# Patient Record
Sex: Female | Born: 1955 | Race: Black or African American | Hispanic: No | Marital: Single | Smoking: Never smoker
Health system: Southern US, Community
[De-identification: ages and names within clinical notes are randomized; demographics above are authoritative.]

## PROBLEM LIST (undated history)

## (undated) ENCOUNTER — Emergency Department (HOSPITAL_COMMUNITY): Discharge status: Absent without leave | Payer: Self-pay

## (undated) DIAGNOSIS — H3320 Serous retinal detachment, unspecified eye: Secondary | ICD-10-CM

## (undated) DIAGNOSIS — I2510 Atherosclerotic heart disease of native coronary artery without angina pectoris: Secondary | ICD-10-CM

## (undated) DIAGNOSIS — R42 Dizziness and giddiness: Secondary | ICD-10-CM

## (undated) DIAGNOSIS — H409 Unspecified glaucoma: Secondary | ICD-10-CM

## (undated) DIAGNOSIS — M1990 Unspecified osteoarthritis, unspecified site: Secondary | ICD-10-CM

## (undated) DIAGNOSIS — I10 Essential (primary) hypertension: Secondary | ICD-10-CM

## (undated) DIAGNOSIS — H269 Unspecified cataract: Secondary | ICD-10-CM

## (undated) HISTORY — PX: EYE SURGERY: SHX253

## (undated) HISTORY — DX: Unspecified osteoarthritis, unspecified site: M19.90

## (undated) HISTORY — DX: Atherosclerotic heart disease of native coronary artery without angina pectoris: I25.10

---

## 1988-09-07 HISTORY — PX: TOTAL ABDOMINAL HYSTERECTOMY: SHX209

## 1997-12-15 ENCOUNTER — Emergency Department (HOSPITAL_COMMUNITY): Admission: EM | Admit: 1997-12-15 | Discharge: 1997-12-15 | Payer: Self-pay | Admitting: *Deleted

## 1998-01-15 ENCOUNTER — Emergency Department (HOSPITAL_COMMUNITY): Admission: EM | Admit: 1998-01-15 | Discharge: 1998-01-15 | Payer: Self-pay | Admitting: Emergency Medicine

## 1998-01-23 ENCOUNTER — Emergency Department (HOSPITAL_COMMUNITY): Admission: EM | Admit: 1998-01-23 | Discharge: 1998-01-23 | Payer: Self-pay | Admitting: Emergency Medicine

## 1998-01-24 ENCOUNTER — Encounter: Admission: RE | Admit: 1998-01-24 | Discharge: 1998-01-24 | Payer: Self-pay | Admitting: Obstetrics

## 1998-01-24 ENCOUNTER — Other Ambulatory Visit: Admission: RE | Admit: 1998-01-24 | Discharge: 1998-01-24 | Payer: Self-pay | Admitting: Obstetrics

## 1998-01-31 ENCOUNTER — Inpatient Hospital Stay (HOSPITAL_COMMUNITY): Admission: AD | Admit: 1998-01-31 | Discharge: 1998-02-04 | Payer: Self-pay | Admitting: Obstetrics & Gynecology

## 1998-02-19 ENCOUNTER — Encounter: Admission: RE | Admit: 1998-02-19 | Discharge: 1998-02-19 | Payer: Self-pay | Admitting: Obstetrics & Gynecology

## 1998-03-19 ENCOUNTER — Encounter: Admission: RE | Admit: 1998-03-19 | Discharge: 1998-03-19 | Payer: Self-pay | Admitting: Obstetrics & Gynecology

## 1998-03-22 ENCOUNTER — Encounter: Admission: RE | Admit: 1998-03-22 | Discharge: 1998-03-22 | Payer: Self-pay | Admitting: Obstetrics & Gynecology

## 1998-05-03 ENCOUNTER — Encounter: Admission: RE | Admit: 1998-05-03 | Discharge: 1998-05-03 | Payer: Self-pay | Admitting: Obstetrics & Gynecology

## 1998-06-25 ENCOUNTER — Inpatient Hospital Stay (HOSPITAL_COMMUNITY): Admission: RE | Admit: 1998-06-25 | Discharge: 1998-06-30 | Payer: Self-pay | Admitting: Obstetrics & Gynecology

## 1998-07-02 ENCOUNTER — Encounter: Admission: RE | Admit: 1998-07-02 | Discharge: 1998-07-02 | Payer: Self-pay | Admitting: Obstetrics & Gynecology

## 1998-07-11 ENCOUNTER — Encounter: Admission: RE | Admit: 1998-07-11 | Discharge: 1998-07-11 | Payer: Self-pay | Admitting: Obstetrics & Gynecology

## 2006-10-01 ENCOUNTER — Ambulatory Visit: Payer: Self-pay | Admitting: Internal Medicine

## 2006-10-11 ENCOUNTER — Ambulatory Visit: Payer: Self-pay | Admitting: *Deleted

## 2006-10-20 ENCOUNTER — Ambulatory Visit: Payer: Self-pay | Admitting: Family Medicine

## 2006-11-04 ENCOUNTER — Ambulatory Visit: Payer: Self-pay | Admitting: Internal Medicine

## 2006-11-10 ENCOUNTER — Ambulatory Visit (HOSPITAL_COMMUNITY): Admission: RE | Admit: 2006-11-10 | Discharge: 2006-11-10 | Payer: Self-pay | Admitting: Internal Medicine

## 2006-11-19 ENCOUNTER — Ambulatory Visit: Payer: Self-pay | Admitting: Family Medicine

## 2006-11-19 ENCOUNTER — Ambulatory Visit (HOSPITAL_COMMUNITY): Admission: RE | Admit: 2006-11-19 | Discharge: 2006-11-19 | Payer: Self-pay | Admitting: Internal Medicine

## 2007-05-19 DIAGNOSIS — I10 Essential (primary) hypertension: Secondary | ICD-10-CM | POA: Insufficient documentation

## 2007-05-25 ENCOUNTER — Encounter (INDEPENDENT_AMBULATORY_CARE_PROVIDER_SITE_OTHER): Payer: Self-pay | Admitting: *Deleted

## 2007-11-29 ENCOUNTER — Ambulatory Visit: Payer: Self-pay | Admitting: Internal Medicine

## 2007-11-29 LAB — CONVERTED CEMR LAB
Bilirubin Urine: NEGATIVE
KOH Prep: NEGATIVE
Ketones, urine, test strip: NEGATIVE
Protein, U semiquant: 30
Urobilinogen, UA: 0.2
Whiff Test: POSITIVE
pH: 5.5

## 2007-12-01 ENCOUNTER — Encounter (INDEPENDENT_AMBULATORY_CARE_PROVIDER_SITE_OTHER): Payer: Self-pay | Admitting: Internal Medicine

## 2007-12-01 LAB — CONVERTED CEMR LAB
BUN: 11 mg/dL (ref 6–23)
CO2: 29 meq/L (ref 19–32)
Calcium: 9.4 mg/dL (ref 8.4–10.5)
Chloride: 103 meq/L (ref 96–112)
Cholesterol: 178 mg/dL (ref 0–200)
Creatinine, Ser: 0.61 mg/dL (ref 0.40–1.20)
HDL: 50 mg/dL (ref >39)
Hemoglobin: 12.8 g/dL (ref 12.0–15.0)
LDL Cholesterol: 111 mg/dL — ABNORMAL HIGH (ref 0–99)
Lymphocytes Relative: 36 % (ref 12–46)
Lymphs Abs: 2.5 10*3/uL (ref 0.7–4.0)
MCHC: 32.6 g/dL (ref 30.0–36.0)
Platelets: 414 10*3/uL — ABNORMAL HIGH (ref 150–400)
RBC: 4.66 M/uL (ref 3.87–5.11)
Sodium: 141 meq/L (ref 135–145)
Total Bilirubin: 0.4 mg/dL (ref 0.3–1.2)
Total CHOL/HDL Ratio: 3.6
Total Protein: 7.6 g/dL (ref 6.0–8.3)
VLDL: 17 mg/dL (ref 0–40)
WBC: 6.8 10*3/uL (ref 4.0–10.5)

## 2007-12-09 ENCOUNTER — Ambulatory Visit (HOSPITAL_COMMUNITY): Admission: RE | Admit: 2007-12-09 | Discharge: 2007-12-09 | Payer: Self-pay | Admitting: Family Medicine

## 2007-12-16 ENCOUNTER — Ambulatory Visit (HOSPITAL_COMMUNITY): Admission: RE | Admit: 2007-12-16 | Discharge: 2007-12-16 | Payer: Self-pay | Admitting: Internal Medicine

## 2007-12-30 ENCOUNTER — Ambulatory Visit: Payer: Self-pay | Admitting: Internal Medicine

## 2008-01-27 ENCOUNTER — Ambulatory Visit: Payer: Self-pay | Admitting: Internal Medicine

## 2008-01-27 DIAGNOSIS — R143 Flatulence: Secondary | ICD-10-CM

## 2008-01-27 DIAGNOSIS — R141 Gas pain: Secondary | ICD-10-CM | POA: Insufficient documentation

## 2008-01-27 DIAGNOSIS — R142 Eructation: Secondary | ICD-10-CM

## 2008-02-17 ENCOUNTER — Ambulatory Visit: Payer: Self-pay | Admitting: Internal Medicine

## 2008-03-29 ENCOUNTER — Ambulatory Visit: Payer: Self-pay | Admitting: Internal Medicine

## 2008-04-27 ENCOUNTER — Ambulatory Visit: Payer: Self-pay | Admitting: Internal Medicine

## 2009-01-25 ENCOUNTER — Ambulatory Visit: Payer: Self-pay | Admitting: Internal Medicine

## 2009-01-25 LAB — CONVERTED CEMR LAB
ALT: 16 units/L (ref 0–35)
Albumin: 4.3 g/dL (ref 3.5–5.2)
Alkaline Phosphatase: 89 units/L (ref 39–117)
Calcium: 10.6 mg/dL — ABNORMAL HIGH (ref 8.4–10.5)
Chloride: 98 meq/L (ref 96–112)
Creatinine, Ser: 0.7 mg/dL (ref 0.40–1.20)
Glucose, Bld: 83 mg/dL (ref 70–99)

## 2009-01-28 ENCOUNTER — Ambulatory Visit (HOSPITAL_COMMUNITY): Admission: RE | Admit: 2009-01-28 | Discharge: 2009-01-28 | Payer: Self-pay | Admitting: Internal Medicine

## 2009-04-03 ENCOUNTER — Encounter: Admission: RE | Admit: 2009-04-03 | Discharge: 2009-04-03 | Payer: Self-pay | Admitting: Internal Medicine

## 2009-04-03 ENCOUNTER — Encounter (INDEPENDENT_AMBULATORY_CARE_PROVIDER_SITE_OTHER): Payer: Self-pay | Admitting: Internal Medicine

## 2010-02-05 ENCOUNTER — Encounter (INDEPENDENT_AMBULATORY_CARE_PROVIDER_SITE_OTHER): Payer: Self-pay | Admitting: *Deleted

## 2010-03-11 ENCOUNTER — Ambulatory Visit: Payer: Self-pay | Admitting: Internal Medicine

## 2010-03-11 DIAGNOSIS — E041 Nontoxic single thyroid nodule: Secondary | ICD-10-CM

## 2010-03-11 LAB — CONVERTED CEMR LAB
ALT: 17 units/L (ref 0–35)
AST: 20 units/L (ref 0–37)
BUN: 8 mg/dL (ref 6–23)
CO2: 27 meq/L (ref 19–32)
Calcium: 9.7 mg/dL (ref 8.4–10.5)
Chloride: 101 meq/L (ref 96–112)
Glucose, Bld: 91 mg/dL (ref 70–99)
Potassium: 4.7 meq/L (ref 3.5–5.3)
Sodium: 139 meq/L (ref 135–145)
Total Bilirubin: 0.4 mg/dL (ref 0.3–1.2)
Total Protein: 7.4 g/dL (ref 6.0–8.3)

## 2010-03-14 ENCOUNTER — Ambulatory Visit (HOSPITAL_COMMUNITY): Admission: RE | Admit: 2010-03-14 | Discharge: 2010-03-14 | Payer: Self-pay | Admitting: Internal Medicine

## 2010-03-31 ENCOUNTER — Encounter (INDEPENDENT_AMBULATORY_CARE_PROVIDER_SITE_OTHER): Payer: Self-pay | Admitting: Internal Medicine

## 2010-04-07 ENCOUNTER — Ambulatory Visit (HOSPITAL_COMMUNITY): Admission: RE | Admit: 2010-04-07 | Discharge: 2010-04-07 | Payer: Self-pay | Admitting: Internal Medicine

## 2010-04-14 ENCOUNTER — Encounter: Admission: RE | Admit: 2010-04-14 | Discharge: 2010-04-14 | Payer: Self-pay | Admitting: Internal Medicine

## 2010-04-16 ENCOUNTER — Encounter (INDEPENDENT_AMBULATORY_CARE_PROVIDER_SITE_OTHER): Payer: Self-pay | Admitting: Internal Medicine

## 2010-09-28 ENCOUNTER — Encounter: Payer: Self-pay | Admitting: Internal Medicine

## 2010-09-29 ENCOUNTER — Encounter: Payer: Self-pay | Admitting: Internal Medicine

## 2010-10-07 NOTE — Assessment & Plan Note (Signed)
Summary: RENEW MEDICATIONS//GK   Vital Signs:  Patient profile:   55 year old female Menstrual status:  hysterectomy Weight:      188 pounds BMI:     35.65 Temp:     97.7 degrees F Pulse rate:   66 / minute Pulse rhythm:   regular Resp:     20 per minute BP sitting:   128 / 80  (left arm) Cuff size:   large  Vitals Entered By: Vesta Mixer CMA (March 11, 2010 9:06 AM) CC: needs med refills Is Patient Diabetic? No Pain Assessment Patient in pain? no       Does patient need assistance? Ambulation Normal   CC:  needs med refills.  History of Present Illness: 1.  Hypertension:  Taking meds, no problems.  Walks a lot, but cannot say how far.  Has gained 11 lbs since last year.  Not really paying attention to her diet.  Pt. is a Museum/gallery exhibitions officer.  Likes to eat nuts.    2.  Multinonodular goiter:  U/S of thyroid was stable in 2009.  No dysphagia.    3.  Health Maintenance:  pt. has not had a CPP in 2 years.  Nonfasting today.  Current Medications (verified): 1)  Micardis Hct 40-12.5 Mg  Tabs (Telmisartan-Hctz) .Marland Kitchen.. 1 Tab By Mouth Daily 2)  Amlodipine Besylate 10 Mg  Tabs (Amlodipine Besylate) .Marland Kitchen.. 1 Tab By Mouth Daily 3)  Catapres-Tts-3 0.3 Mg/24hr  Ptwk (Clonidine Hcl) .... Reapply 1 Patch To Skin Every 7 Days 4)  Augmentin 875-125 Mg  Tabs (Amoxicillin-Pot Clavulanate) .Marland Kitchen.. 1 Tab By Mouth Two Times A Day For 7 Days 5)  Toprol Xl 25 Mg Xr24h-Tab (Metoprolol Succinate) .Marland Kitchen.. 1 Tab By Mouth Daily  Allergies (verified): No Known Drug Allergies  Physical Exam  Neck:  What feels like a 1 1/2 cm nodule on left and a bit smaller lesion on right Lungs:  Normal respiratory effort, chest expands symmetrically. Lungs are clear to auscultation, no crackles or wheezes. Heart:  Normal rate and regular rhythm. S1 and S2 normal without gallop, murmur, click, rub or other extra sounds.  Radial pulses normal and equal Extremities:  No edema   Impression & Recommendations:  Problem # 1:   HYPERTENSION (ICD-401.9)  Her updated medication list for this problem includes:    Micardis Hct 40-12.5 Mg Tabs (Telmisartan-hctz) .Marland Kitchen... 1 tab by mouth daily    Amlodipine Besylate 10 Mg Tabs (Amlodipine besylate) .Marland Kitchen... 1 tab by mouth daily    Catapres-tts-3 0.3 Mg/24hr Ptwk (Clonidine hcl) .Marland Kitchen... Reapply 1 patch to skin every 7 days    Toprol Xl 25 Mg Xr24h-tab (Metoprolol succinate) .Marland Kitchen... 1 tab by mouth daily  Problem # 2:  THYROID NODULE (ICD-241.0) Feels like either larger nodules or new ones. Orders: T-TSH ([id number]) UA Dipstick w/o Micro (manual) ([address]) Ultrasound (Ultrasound)  Complete Medication List: 1)  Micardis Hct 40-12.5 Mg Tabs (Telmisartan-hctz) .Marland Kitchen.. 1 tab by mouth daily 2)  Amlodipine Besylate 10 Mg Tabs (Amlodipine besylate) .Marland Kitchen.. 1 tab by mouth daily 3)  Catapres-tts-3 0.3 Mg/24hr Ptwk (Clonidine hcl) .... Reapply 1 patch to skin every 7 days 4)  Toprol Xl 25 Mg Xr24h-tab (Metoprolol succinate) .Marland Kitchen.. 1 tab by mouth daily  Other Orders: T-Comprehensive Metabolic Panel ([id number])  Patient Instructions: 1)  CPP with Dr. Delrae Alfred in next 4 months Prescriptions: TOPROL XL 25 MG XR24H-TAB (METOPROLOL SUCCINATE) 1 tab by mouth daily  #30 x 11   Entered and Authorized by:   Lanora Manis  Amita Atayde MD   Signed by:   Julieanne Manson MD on 03/11/2010   Method used:   Faxed to ...       [facility] - [facility] (retail)       [address] [place], [place]  [address]       Ph: [phone]       Fax: [phone]   RxID:   [id number] CATAPRES-TTS-3 0.3 MG/24HR  PTWK (CLONIDINE HCL) reapply 1 patch to skin every 7 days  #4 x 11   Entered and Authorized by:   Julieanne Manson MD   Signed by:   Julieanne Manson MD on 03/11/2010   Method used:   Faxed to ...       [facility] - [facility] (retail)       [address] [place], [place]  [address]       Ph: [phone]       Fax: [phone]    RxID:   [id number] AMLODIPINE BESYLATE 10 MG  TABS (AMLODIPINE BESYLATE) 1 tab by mouth daily  #30 x 11   Entered and Authorized by:   Julieanne Manson MD   Signed by:   Julieanne Manson MD on 03/11/2010   Method used:   Faxed to ...       [facility] - [facility] (retail)       [address] [place], [place]  [address]       Ph: [phone]       Fax: [phone]   RxID:   [id number] MICARDIS HCT 40-12.5 MG  TABS (TELMISARTAN-HCTZ) 1 tab by mouth daily  #30 x 11   Entered and Authorized by:   Julieanne Manson MD   Signed by:   Julieanne Manson MD on 03/11/2010   Method used:   Faxed to ...       [facility] - [facility] (retail)       [address] [place], [place]  [address]       Ph: [phone]       Fax: [phone]   RxID:   [id number]   Appended Document: RENEW MEDICATIONS//GK  Laboratory Results   Urine Tests    Routine Urinalysis   Glucose: negative   (Normal Range: Negative) Bilirubin: negative   (Normal Range: Negative) Ketone: negative   (Normal Range: Negative) Spec. Gravity: 1.010   (Normal Range: 1.003-1.035) Blood: negative   (Normal Range: Negative) pH: 8.0   (Normal Range: 5.0-8.0) Protein: negative   (Normal Range: Negative) Urobilinogen: 0.2   (Normal Range: 0-1) Nitrite: negative   (Normal Range: Negative) Leukocyte Esterace: small   (Normal Range: Negative)

## 2010-10-07 NOTE — Letter (Signed)
Summary: *HSN Results Follow up  [facility]  [address] [place], [place] [address]   Phone: [phone]  Fax: [phone]      02/05/2010   [facility] [place] [address] [name], [place]  [address]   Dear  Ms. [name],                            ____S.Drinkard,FNP   ____D. Gore,FNP       ____B. McPherson,MD   ____V. Rankins,MD    __x__E. Mulberry,MD    ____N. Daphine Deutscher, FNP  ____D. Reche Dixon, MD    ____K. Philipp Deputy, MD    ____Other     This letter is to inform you that your recent test(s):  _______Pap Smear    _______Lab Test     _______X-ray __X__Prescription Refill    _______ is within acceptable limits  _______ requires a medication change  _______ requires a follow-up lab visit  ____XX___ requires a follow-up visit with your provider   Comments:  Please call to schedule an appointment with Dr. Delrae Alfred.  Thank You.       _________________________________________________________ If you have any questions, please contact our office                     Sincerely,  Vesta Mixer CMA [facility]

## 2010-10-07 NOTE — Letter (Signed)
Summary: test order form//ultrasound //appt date & time  test order form//ultrasound //appt date & time   Imported By: Arta Bruce 03/11/2010 14:12:10  _____________________________________________________________________  External Attachment:    Type:   Image     Comment:   External Document

## 2010-10-07 NOTE — Letter (Signed)
Summary: *HSN Results Follow up  [facility]  [address] [place], [place] [address]   Phone: [phone]  Fax: [phone]      03/31/2010   [facility] [place] [address] [name], [place]  [address]   Dear  Ms. Kylie Harris,                            ____S.Drinkard,FNP   ____D. Gore,FNP       ____B. McPherson,MD   ____V. Rankins,MD    __X__E. Atoya Andrew,MD    ____N. Daphine Deutscher, FNP  ____D. Reche Dixon, MD    ____K. Philipp Deputy, MD    ____Other     This letter is to inform you that your recent test(s):  _______Pap Smear    ____X___Lab Test     ____X___X-ray    _______ is within acceptable limits  _______ requires a medication change  _______ requires a follow-up lab visit  _______ requires a follow-up visit with your provider   Comments:  Your thyroid nodules on ultrasound are stable in size--recommend looking at them again in 1 year with ultrasound.  Your blood tests were all okay--kidney, liver, thyroid, sodium, calcium --all tested fine.       _________________________________________________________ If you have any questions, please contact our office                     Sincerely,  Julieanne Manson MD [facility]

## 2011-04-16 ENCOUNTER — Emergency Department (HOSPITAL_COMMUNITY)
Admission: EM | Admit: 2011-04-16 | Discharge: 2011-04-17 | Disposition: A | Discharge status: Home / Self Care | Payer: Self-pay | Attending: Emergency Medicine | Admitting: Emergency Medicine

## 2011-04-16 ENCOUNTER — Emergency Department (HOSPITAL_COMMUNITY): Payer: Self-pay

## 2011-04-16 DIAGNOSIS — M79609 Pain in unspecified limb: Secondary | ICD-10-CM | POA: Insufficient documentation

## 2011-04-16 DIAGNOSIS — S91309A Unspecified open wound, unspecified foot, initial encounter: Secondary | ICD-10-CM | POA: Insufficient documentation

## 2011-04-16 DIAGNOSIS — I10 Essential (primary) hypertension: Secondary | ICD-10-CM | POA: Insufficient documentation

## 2011-04-16 DIAGNOSIS — IMO0001 Reserved for inherently not codable concepts without codable children: Secondary | ICD-10-CM | POA: Insufficient documentation

## 2011-04-16 DIAGNOSIS — W278XXA Contact with other nonpowered hand tool, initial encounter: Secondary | ICD-10-CM | POA: Insufficient documentation

## 2011-11-18 ENCOUNTER — Other Ambulatory Visit: Payer: Self-pay

## 2011-11-18 ENCOUNTER — Emergency Department (HOSPITAL_COMMUNITY): Payer: Self-pay

## 2011-11-18 ENCOUNTER — Emergency Department (HOSPITAL_COMMUNITY)
Admission: EM | Admit: 2011-11-18 | Discharge: 2011-11-18 | Disposition: A | Discharge status: Home / Self Care | Payer: Self-pay | Attending: Emergency Medicine | Admitting: Emergency Medicine

## 2011-11-18 ENCOUNTER — Encounter (HOSPITAL_COMMUNITY): Payer: Self-pay | Admitting: Emergency Medicine

## 2011-11-18 DIAGNOSIS — R112 Nausea with vomiting, unspecified: Secondary | ICD-10-CM | POA: Insufficient documentation

## 2011-11-18 DIAGNOSIS — Z79899 Other long term (current) drug therapy: Secondary | ICD-10-CM | POA: Insufficient documentation

## 2011-11-18 DIAGNOSIS — L819 Disorder of pigmentation, unspecified: Secondary | ICD-10-CM | POA: Insufficient documentation

## 2011-11-18 DIAGNOSIS — R42 Dizziness and giddiness: Secondary | ICD-10-CM | POA: Insufficient documentation

## 2011-11-18 DIAGNOSIS — H538 Other visual disturbances: Secondary | ICD-10-CM | POA: Insufficient documentation

## 2011-11-18 DIAGNOSIS — I10 Essential (primary) hypertension: Secondary | ICD-10-CM | POA: Insufficient documentation

## 2011-11-18 HISTORY — DX: Essential (primary) hypertension: I10

## 2011-11-18 LAB — COMPREHENSIVE METABOLIC PANEL
Alkaline Phosphatase: 122 U/L — ABNORMAL HIGH (ref 39–117)
BUN: 10 mg/dL (ref 6–23)
CO2: 26 mEq/L (ref 19–32)
Calcium: 9 mg/dL (ref 8.4–10.5)
GFR calc non Af Amer: >90 mL/min (ref >90)
Potassium: 3.7 mEq/L (ref 3.5–5.1)
Sodium: 142 mEq/L (ref 135–145)
Total Bilirubin: 0.3 mg/dL (ref 0.3–1.2)
Total Protein: 7.9 g/dL (ref 6.0–8.3)

## 2011-11-18 LAB — DIFFERENTIAL
Basophils Relative: 0 % (ref 0–1)
Eosinophils Absolute: 0 10*3/uL (ref 0.0–0.7)
Eosinophils Relative: 0 % (ref 0–5)
Lymphocytes Relative: 30 % (ref 12–46)
Lymphs Abs: 2.2 10*3/uL (ref 0.7–4.0)
Monocytes Absolute: 0.3 10*3/uL (ref 0.1–1.0)
Monocytes Relative: 5 % (ref 3–12)
Neutro Abs: 4.8 10*3/uL (ref 1.7–7.7)

## 2011-11-18 LAB — CBC
Platelets: 331 10*3/uL (ref 150–400)
RDW: 14.2 % (ref 11.5–15.5)
WBC: 7.3 10*3/uL (ref 4.0–10.5)

## 2011-11-18 MED ORDER — LORAZEPAM 1 MG PO TABS
1.0000 mg | ORAL_TABLET | Freq: Once | ORAL | Status: AC
  Administered 2011-11-18: 1 mg via ORAL
  Filled 2011-11-18: qty 1

## 2011-11-18 MED ORDER — MECLIZINE HCL 50 MG PO TABS: 50.0000 mg | ORAL_TABLET | Freq: Three times a day (TID) | ORAL | Status: AC | PRN

## 2011-11-18 MED ORDER — LABETALOL HCL 200 MG PO TABS
200.0000 mg | ORAL_TABLET | Freq: Once | ORAL | Status: AC
  Administered 2011-11-18: 200 mg via ORAL
  Filled 2011-11-18: qty 1

## 2011-11-18 MED ORDER — METOPROLOL SUCCINATE ER 25 MG PO TB24: 25.0000 mg | ORAL_TABLET | Freq: Every day | ORAL | Status: DC

## 2011-11-18 MED ORDER — PROMETHAZINE HCL 25 MG PO TABS: 25.0000 mg | ORAL_TABLET | Freq: Four times a day (QID) | ORAL | Status: AC | PRN

## 2011-11-18 MED ORDER — ONDANSETRON HCL 8 MG PO TABS
8.0000 mg | ORAL_TABLET | Freq: Once | ORAL | Status: AC
  Administered 2011-11-18: 8 mg via ORAL
  Filled 2011-11-18: qty 1

## 2011-11-18 MED ORDER — MECLIZINE HCL 25 MG PO TABS
25.0000 mg | ORAL_TABLET | Freq: Once | ORAL | Status: AC
  Administered 2011-11-18: 25 mg via ORAL
  Filled 2011-11-18: qty 1

## 2011-11-18 MED ORDER — MECLIZINE HCL 25 MG PO TABS: 25.0000 mg | ORAL_TABLET | Freq: Once | ORAL | Status: DC

## 2011-11-18 MED ORDER — CLONIDINE HCL 0.1 MG/24HR TD PTWK: 1.0000 | MEDICATED_PATCH | TRANSDERMAL | Status: DC

## 2011-11-18 MED ORDER — LORAZEPAM 2 MG/ML IJ SOLN
1.0000 mg | Freq: Once | INTRAMUSCULAR | Status: AC
  Administered 2011-11-18: 1 mg via INTRAVENOUS
  Filled 2011-11-18: qty 1

## 2011-11-18 MED ORDER — AMLODIPINE BESYLATE 10 MG PO TABS: 10.0000 mg | ORAL_TABLET | Freq: Every day | ORAL | Status: DC

## 2011-11-18 MED ORDER — TELMISARTAN-HCTZ 40-12.5 MG PO TABS: 1.0000 | ORAL_TABLET | Freq: Every day | ORAL | Status: DC

## 2011-11-18 NOTE — ED Notes (Signed)
Patient transported to CT 

## 2011-11-18 NOTE — Discharge Instructions (Signed)
Restart all your blood pressure medications. Take the meclizine and phenergan for your nausea and dizziness. Keep your appointment in April with your doctor so you don't run out of your medication again. Return to the ED if you feel worse.

## 2011-11-18 NOTE — ED Notes (Signed)
Patient transported to MRI 

## 2011-11-18 NOTE — ED Notes (Signed)
PT. WOKE UP THIS MORNING WITH DIZZINESS / LIGHTHEADED , DENIES NAUSEA OR VOMITTING . ALERT AND ORIENTED.

## 2011-11-18 NOTE — ED Provider Notes (Cosign Needed)
History     CSN: [id number]  Arrival date & time 11/18/11  4098   First MD Initiated Contact with Patient 11/18/11 [phone]      Chief Complaint  Patient presents with  . Dizziness    (Consider location/radiation/quality/duration/timing/severity/associated sxs/prior treatment) HPI  Patient relates when she got up out of bed today she felt a little bit dizzy, this was about 4 AM and this is her usual time to get up to go to work. She relates however when she started to walk she felt like she was stumbling and losing her balance. She also had a spinning sensation. She states she has a constantly but it feels worse when she stands up. She denies headache, she has had some nausea and vomiting. She states her vision is blurred. She denies numbness or tingling in her arms or legs. She denies chest pain or shortness of breath. She states she's never had this before. Patient relates she has a history of hypertension however she ran out of her medicine 3 months ago and states she takes 3 pills. She states her next appointment with her doctor is in April.  PCP [facility] on [address]  Past Medical History  Diagnosis Date  . Hypertension     History reviewed. No pertinent past surgical history.  No family history on file. Hypertension Diabetes  History  Substance Use Topics  . Smoking status: Never Smoker   . Smokeless tobacco: Not on file  . Alcohol Use: Yes   employed at [employer]  OB History    Grav Para Term Preterm Abortions TAB SAB Ect Mult Living                  Review of Systems  All other systems reviewed and are negative.    Allergies  Review of patient's allergies indicates no known allergies.  Home Medications   Current Outpatient Rx  Name Route Sig Dispense Refill  . AMLODIPINE BESYLATE 10 MG PO TABS Oral Take 10 mg by mouth daily.    Marland Kitchen CLONIDINE HCL 0.1 MG/24HR TD PTWK Transdermal Place 1 patch onto the skin once a week.    Marland Kitchen METOPROLOL SUCCINATE ER 25 MG PO TB24 Oral  Take 25 mg by mouth daily.    . TELMISARTAN-HCTZ 40-12.5 MG PO TABS Oral Take 1 tablet by mouth daily.    Has not taken in months  BP 190/83  Pulse 77  Temp(Src) 97.9 F (36.6 C) (Oral)  Resp 20  SpO2 100%  Vital signs normal except hypertension   Physical Exam  Constitutional: She is oriented to person, place, and time. She appears well-developed and well-nourished.  Non-toxic appearance. She does not appear ill. No distress.  HENT:  Head: Normocephalic and atraumatic.  Right Ear: External ear normal.  Left Ear: External ear normal.  Nose: Nose normal. No mucosal edema or rhinorrhea.  Mouth/Throat: Oropharynx is clear and moist and mucous membranes are normal. No dental abscesses or uvula swelling.       Patient has some hypopigmented and hyperpigmented skin around her right thigh which appears to be old  Eyes: Conjunctivae and EOM are normal. Pupils are equal, round, and reactive to light.       No nystagymus seen.  Neck: Normal range of motion and full passive range of motion without pain. Neck supple.  Cardiovascular: Normal rate, regular rhythm and normal heart sounds.  Exam reveals no gallop and no friction rub.   No murmur heard. Pulmonary/Chest: Effort normal  and breath sounds normal. No respiratory distress. She has no wheezes. She has no rhonchi. She has no rales. She exhibits no tenderness and no crepitus.  Abdominal: Soft. Normal appearance and bowel sounds are normal. She exhibits no distension. There is no tenderness. There is no rebound and no guarding.  Musculoskeletal: Normal range of motion. She exhibits no edema and no tenderness.       Moves all extremities well.   Neurological: She is alert and oriented to person, place, and time. She has normal strength. No cranial nerve deficit.       Patient has no pronator drift. She has no weakness in her grip on either side. Patient is right handed. She has no motor weakness in her legs.  Skin: Skin is warm, dry and  intact. No rash noted. No erythema. No pallor.  Psychiatric: She has a normal mood and affect. Her speech is normal and behavior is normal. Her mood appears not anxious.    ED Course  Procedures (including critical care time)  I have talked to the pharmacy tech to get her meds from [facility].   Medications  labetalol (NORMODYNE) tablet 200 mg (200 mg Oral Given 11/18/11 0920)  meclizine (ANTIVERT) tablet 25 mg (25 mg Oral Given 11/18/11 0800)  ondansetron (ZOFRAN) tablet 8 mg (8 mg Oral Given 11/18/11 0801)  LORazepam (ATIVAN) tablet 1 mg (1 mg Oral Given 11/18/11 1106)  LORazepam (ATIVAN) injection 1 mg (1 mg Intravenous Given 11/18/11 1327)    10:35 Recheck, BP now 145/89 and pt states she still has dizziness and feels off balance. Will proceed with MRI.   Pt went to MRI and couldn't do test, came back to ED and given more ativan.  BP at discharge is 135/90. States her dizziness is mild now.    Results for orders placed during the hospital encounter of 11/18/11  CBC      Component Value Range   WBC 7.3  4.0 - 10.5 (K/uL)   RBC 4.98  3.87 - 5.11 (MIL/uL)   Hemoglobin 13.5  12.0 - 15.0 (g/dL)   HCT 40.9  81.1 - 91.4 (%)   MCV 81.3  78.0 - 100.0 (fL)   MCH 27.1  26.0 - 34.0 (pg)   MCHC 33.3  30.0 - 36.0 (g/dL)   RDW 78.2  95.6 - 21.3 (%)   Platelets 331  150 - 400 (K/uL)  DIFFERENTIAL      Component Value Range   Neutrophils Relative 65  43 - 77 (%)   Neutro Abs 4.8  1.7 - 7.7 (K/uL)   Lymphocytes Relative 30  12 - 46 (%)   Lymphs Abs 2.2  0.7 - 4.0 (K/uL)   Monocytes Relative 5  3 - 12 (%)   Monocytes Absolute 0.3  0.1 - 1.0 (K/uL)   Eosinophils Relative 0  0 - 5 (%)   Eosinophils Absolute 0.0  0.0 - 0.7 (K/uL)   Basophils Relative 0  0 - 1 (%)   Basophils Absolute 0.0  0.0 - 0.1 (K/uL)  COMPREHENSIVE METABOLIC PANEL      Component Value Range   Sodium 142  135 - 145 (mEq/L)   Potassium 3.7  3.5 - 5.1 (mEq/L)   Chloride 106  96 - 112 (mEq/L)   CO2 26  19 - 32  (mEq/L)   Glucose, Bld 110 (*) 70 - 99 (mg/dL)   BUN 10  6 - 23 (mg/dL)   Creatinine, Ser 0.86 (*) 0.50 - 1.10 (mg/dL)  Calcium 9.0  8.4 - 10.5 (mg/dL)   Total Protein 7.9  6.0 - 8.3 (g/dL)   Albumin 3.9  3.5 - 5.2 (g/dL)   AST 20  0 - 37 (U/L)   ALT 19  0 - 35 (U/L)   Alkaline Phosphatase 122 (*) 39 - 117 (U/L)   Total Bilirubin 0.3  0.3 - 1.2 (mg/dL)   GFR calc non Af Amer >90  >90 (mL/min)   GFR calc Af Amer >90  >90 (mL/min)   Vital signs normal    Ct Head Wo Contrast  11/18/2011  *RADIOLOGY REPORT*  Clinical Data: Dizziness and lightheadedness.  Frontal headache. History of hypertension.  CT HEAD WITHOUT CONTRAST  Technique:  Contiguous axial images were obtained from the base of the skull through the vertex without contrast.  Comparison: No priors.  Findings: Mild cerebellar atrophy.  No definite acute intracranial abnormalities.  Specifically, no definite signs to suggest acute/subacute cerebral ischemia, no focal mass, mass effect, hydrocephalus or abnormal intra or extra-axial fluid collections. No displaced skull fractures are noted.  There is a small amount of fluid within the posterior right mastoid.  Otherwise, the remaining portions of the visualized mastoids and paranasal sinuses are unremarkable.  IMPRESSION: 1.  No acute intracranial abnormalities. 2.  Mild cerebellar atrophy.  The appearance of the brain is otherwise normal. 3.  Small amount of fluid in the posterior aspect of the right mastoid.  Original Report Authenticated By: Florencia Reasons, M.D.       Date: 11/18/2011  Rate: 70  Rhythm: normal sinus rhythm  QRS Axis: normal  Intervals: normal  ST/T Wave abnormalities: nonspecific T wave changes  Conduction Disutrbances:none  Narrative Interpretation:   Old EKG Reviewed: unchanged from 06/21/1998     1. Vertigo   2. Hypertension    New Prescriptions   AMLODIPINE (NORVASC) 10 MG TABLET    Take 1 tablet (10 mg total) by mouth daily.   CLONIDINE  (CATAPRES - DOSED IN MG/24 HR) 0.1 MG/24HR PATCH    Place 1 patch (0.1 mg total) onto the skin once a week.   MECLIZINE (ANTIVERT) 50 MG TABLET    Take 1 tablet (50 mg total) by mouth 3 (three) times daily as needed for dizziness.   METOPROLOL SUCCINATE (TOPROL-XL) 25 MG 24 HR TABLET    Take 1 tablet (25 mg total) by mouth daily.   PROMETHAZINE (PHENERGAN) 25 MG TABLET    Take 1 tablet (25 mg total) by mouth every 6 (six) hours as needed for nausea.   TELMISARTAN-HYDROCHLOROTHIAZIDE (MICARDIS HCT) 40-12.5 MG PER TABLET    Take 1 tablet by mouth daily.   Plan discharge Devoria Albe, MD, FACEP   MDM          Ward Givens, MD 11/18/11 [phone]

## 2011-11-18 NOTE — ED Notes (Signed)
Pt returned from MRI, staff reports that she was unable to tolerate. Dr. Lynelle Doctor informed.

## 2012-02-14 ENCOUNTER — Encounter (HOSPITAL_COMMUNITY): Payer: Self-pay | Admitting: Emergency Medicine

## 2012-02-14 ENCOUNTER — Emergency Department (HOSPITAL_COMMUNITY)
Admission: EM | Admit: 2012-02-14 | Discharge: 2012-02-14 | Disposition: A | Discharge status: Home / Self Care | Payer: Self-pay | Attending: Emergency Medicine | Admitting: Emergency Medicine

## 2012-02-14 DIAGNOSIS — H109 Unspecified conjunctivitis: Secondary | ICD-10-CM

## 2012-02-14 DIAGNOSIS — I10 Essential (primary) hypertension: Secondary | ICD-10-CM | POA: Insufficient documentation

## 2012-02-14 DIAGNOSIS — B9689 Other specified bacterial agents as the cause of diseases classified elsewhere: Secondary | ICD-10-CM | POA: Insufficient documentation

## 2012-02-14 DIAGNOSIS — H1089 Other conjunctivitis: Secondary | ICD-10-CM | POA: Insufficient documentation

## 2012-02-14 DIAGNOSIS — A499 Bacterial infection, unspecified: Secondary | ICD-10-CM | POA: Insufficient documentation

## 2012-02-14 MED ORDER — POLYMYXIN B-TRIMETHOPRIM 10000-0.1 UNIT/ML-% OP SOLN: 1.0000 [drp] | OPHTHALMIC | Status: AC

## 2012-02-14 MED ORDER — CEPHALEXIN 500 MG PO CAPS: 500.0000 mg | ORAL_CAPSULE | Freq: Four times a day (QID) | ORAL | Status: AC

## 2012-02-14 MED ORDER — POLYMYXIN B-TRIMETHOPRIM 10000-0.1 UNIT/ML-% OP SOLN
2.0000 [drp] | OPHTHALMIC | Status: AC
  Administered 2012-02-14: 2 [drp] via OPHTHALMIC
  Filled 2012-02-14: qty 10

## 2012-02-14 MED ORDER — CLINDAMYCIN HCL 300 MG PO CAPS
300.0000 mg | ORAL_CAPSULE | ORAL | Status: AC
  Administered 2012-02-14: 300 mg via ORAL
  Filled 2012-02-14: qty 1

## 2012-02-14 NOTE — ED Provider Notes (Signed)
History     CSN: [id number]  Arrival date & time 02/14/12  1441   First MD Initiated Contact with Patient 02/14/12 1456      Chief Complaint  Patient presents with  . Eye Pain    left eye    Patient is a 56 y.o. female presenting with eye pain. The history is provided by the patient.  Eye Pain This is a new problem. The current episode started in the past 7 days (6 days ). The problem occurs constantly. The problem has been gradually worsening. Pertinent negatives include no abdominal pain, chest pain, chills, coughing, fever, neck pain or vomiting. The symptoms are aggravated by nothing. She has tried nothing for the symptoms.    Past Medical History  Diagnosis Date  . Hypertension     History reviewed. No pertinent past surgical history.  History reviewed. No pertinent family history.  History  Substance Use Topics  . Smoking status: Never Smoker   . Smokeless tobacco: Not on file  . Alcohol Use: Yes    OB History    Grav Para Term Preterm Abortions TAB SAB Ect Mult Living                  Review of Systems  Constitutional: Negative for fever, chills, activity change and appetite change.  HENT: Negative for neck pain and neck stiffness.   Eyes: Positive for pain, redness and itching. Negative for photophobia, discharge and visual disturbance.  Respiratory: Negative for cough, chest tightness, shortness of breath and wheezing.   Cardiovascular: Negative for chest pain and palpitations.  Gastrointestinal: Negative for vomiting, abdominal pain, diarrhea and constipation.  Genitourinary: Negative for dysuria, decreased urine volume and difficulty urinating.  All other systems reviewed and are negative.    Allergies  Review of patient's allergies indicates no known allergies.  Home Medications   Current Outpatient Rx  Name Route Sig Dispense Refill  . AMLODIPINE BESYLATE 10 MG PO TABS Oral Take 10 mg by mouth daily.    Marland Kitchen CLONIDINE HCL 0.1 MG/24HR TD PTWK  Transdermal Place 1 patch onto the skin once a week. On Tuesdays    . IBUPROFEN 200 MG PO TABS Oral Take 200 mg by mouth every 6 (six) hours as needed. For pain    . METOPROLOL SUCCINATE ER 25 MG PO TB24 Oral Take 25 mg by mouth daily.    . TELMISARTAN-HCTZ 40-12.5 MG PO TABS Oral Take 1 tablet by mouth daily.      BP 134/83  Pulse 64  Temp(Src) 98.3 F (36.8 C) (Oral)  Resp 18  SpO2 99%  Physical Exam  Nursing note and vitals reviewed. Constitutional: She is oriented to person, place, and time. She appears well-developed and well-nourished.  HENT:  Head: Normocephalic and atraumatic.  Right Ear: External ear normal.  Left Ear: External ear normal.  Nose: Nose normal.  Mouth/Throat: Oropharynx is clear and moist. No oropharyngeal exudate.  Eyes: Pupils are equal, round, and reactive to light. Left eye exhibits no discharge and no exudate. Left conjunctiva is injected. Left conjunctiva has no hemorrhage. No scleral icterus. Left eye exhibits normal extraocular motion and no nystagmus.  Fundoscopic exam:      The right eye shows no papilledema. The right eye shows red reflex.      The left eye shows no papilledema. The left eye shows red reflex. Slit lamp exam:      The right eye shows no corneal abrasion, no corneal flare and no corneal  ulcer.       The left eye shows no corneal abrasion, no corneal flare and no corneal ulcer.  Neck: Normal range of motion. Neck supple.  Cardiovascular: Normal rate, regular rhythm, normal heart sounds and intact distal pulses.  Exam reveals no gallop and no friction rub.   No murmur heard. Abdominal: Soft. Bowel sounds are normal. She exhibits no distension and no mass. There is no tenderness. There is no rebound and no guarding.  Musculoskeletal: Normal range of motion. She exhibits no edema and no tenderness.  Neurological: She is alert and oriented to person, place, and time.  Skin: Skin is warm and dry.  Psychiatric: She has a normal mood and  affect. Her behavior is normal. Judgment and thought content normal.    ED Course  Procedures (including critical care time)  Labs Reviewed - No data to display No results found.   1. Bacterial conjunctivitis of left eye       MDM  55 yo F presents for 1 week of left eye erythema and upper/lower lid edema. Pt reports that her vision has not been affected but that the eye itself hurts. No different pain with extraocular motion. No jaw claudication. No reduction in visual acuity on exam. No photophobia. No fixed pupils. No history of trauma to the eye. No associated headache. Clinical picture not concerning for optic neuritis, orbital cellulitis, corneal abrasion/ulcer, or temporal arteritis. Suspect bacterial conjunctivitis. Will prescribe PO Keflex and polytrim drops (PO Keflex for the lid edema). Patient given return precautions, including worsening of signs or symptoms. Patient instructed to follow-up with primary care physician if not better in 3 days.             Clemetine Marker, MD 02/14/12 [phone]

## 2012-02-14 NOTE — Discharge Instructions (Signed)
Conjunctivitis Conjunctivitis is commonly called "pink eye." Conjunctivitis can be caused by bacterial or viral infection, allergies, or injuries. There is usually redness of the lining of the eye, itching, discomfort, and sometimes discharge. There may be deposits of matter along the eyelids. A viral infection usually causes a watery discharge, while a bacterial infection causes a yellowish, thick discharge. Pink eye is very contagious and spreads by direct contact. You may be given antibiotic eyedrops as part of your treatment. Before using your eye medicine, remove all drainage from the eye by washing gently with warm water and cotton balls. Continue to use the medication until you have awakened 2 mornings in a row without discharge from the eye. Do not rub your eye. This increases the irritation and helps spread infection. Use separate towels from other household members. Wash your hands with soap and water before and after touching your eyes. Use cold compresses to reduce pain and sunglasses to relieve irritation from light. Do not wear contact lenses or wear eye makeup until the infection is gone. SEEK MEDICAL CARE IF:   Your symptoms are not better after 3 days of treatment.   You have increased pain or trouble seeing.   The outer eyelids become very red or swollen.  Document Released: 10/01/2004 Document Revised: 08/13/2011 Document Reviewed: 08/24/2005 [employer] Patient Information 2012 [employer]. 

## 2012-02-14 NOTE — ED Notes (Signed)
Pt c/o left eye irritation x 1 week.

## 2012-02-15 NOTE — ED Provider Notes (Signed)
I saw and evaluated the patient, reviewed the resident's note and I agree with the findings and plan.  1 week of L eye redness and pain. NO visual change or trauma. AC clear. No pain with EOMI. No fever.  Conjunctival injection with chemosis.IOP wnl. No temporal artery tenderenss  Glynn Octave, MD 02/15/12 [phone]

## 2012-02-23 ENCOUNTER — Emergency Department (HOSPITAL_COMMUNITY)
Admission: EM | Admit: 2012-02-23 | Discharge: 2012-02-23 | Disposition: A | Discharge status: Home / Self Care | Payer: Self-pay | Attending: Emergency Medicine | Admitting: Emergency Medicine

## 2012-02-23 ENCOUNTER — Emergency Department (HOSPITAL_COMMUNITY): Payer: Self-pay

## 2012-02-23 ENCOUNTER — Encounter (HOSPITAL_COMMUNITY): Payer: Self-pay | Admitting: Neurology

## 2012-02-23 DIAGNOSIS — H60399 Other infective otitis externa, unspecified ear: Secondary | ICD-10-CM | POA: Insufficient documentation

## 2012-02-23 DIAGNOSIS — H6090 Unspecified otitis externa, unspecified ear: Secondary | ICD-10-CM

## 2012-02-23 DIAGNOSIS — I10 Essential (primary) hypertension: Secondary | ICD-10-CM | POA: Insufficient documentation

## 2012-02-23 DIAGNOSIS — I517 Cardiomegaly: Secondary | ICD-10-CM | POA: Insufficient documentation

## 2012-02-23 LAB — COMPREHENSIVE METABOLIC PANEL
AST: 13 U/L (ref 0–37)
Albumin: 3.7 g/dL (ref 3.5–5.2)
Calcium: 10.3 mg/dL (ref 8.4–10.5)
Chloride: 94 mEq/L — ABNORMAL LOW (ref 96–112)
Creatinine, Ser: 0.63 mg/dL (ref 0.50–1.10)
Total Bilirubin: 0.3 mg/dL (ref 0.3–1.2)
Total Protein: 8 g/dL (ref 6.0–8.3)

## 2012-02-23 LAB — CBC
MCHC: 33.2 g/dL (ref 30.0–36.0)
MCV: 81.6 fL (ref 78.0–100.0)
Platelets: 430 10*3/uL — ABNORMAL HIGH (ref 150–400)
RDW: 13.3 % (ref 11.5–15.5)
WBC: 8.7 10*3/uL (ref 4.0–10.5)

## 2012-02-23 LAB — SEDIMENTATION RATE: Sed Rate: 31 mm/hr — ABNORMAL HIGH (ref 0–22)

## 2012-02-23 LAB — C-REACTIVE PROTEIN: CRP: 1.27 mg/dL — ABNORMAL HIGH (ref <0.60)

## 2012-02-23 MED ORDER — ACETIC ACID-ALUMINUM ACETATE 2 % OT SOLN: 4.0000 [drp] | OTIC | Status: DC

## 2012-02-23 MED ORDER — ACETIC ACID 2 % OT SOLN: 4.0000 [drp] | Freq: Three times a day (TID) | OTIC | Status: AC

## 2012-02-23 MED ORDER — CIPROFLOXACIN-HYDROCORTISONE 0.2-1 % OT SUSP
3.0000 [drp] | Freq: Two times a day (BID) | OTIC | Status: AC
Start: 2012-02-23 — End: 2012-03-03

## 2012-02-23 NOTE — ED Notes (Signed)
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

## 2012-02-23 NOTE — ED Provider Notes (Signed)
History     CSN: [id number]  Arrival date & time 02/23/12  2706   None     Chief Complaint  Patient presents with  . Otalgia    (Consider location/radiation/quality/duration/timing/severity/associated sxs/prior treatment) HPI  56 year old female past medical history of hypertension presents today with a two-day history of left ear swelling.  she was seen approximately one week ago for left eye presumed conjunctivitis.  Denies fevers nausea vomiting diarrhea headache.  Calls her discomfort moderate. Touching it makes it worse. Nothing makes it better. Pain 5/10. Vital signs normal arrival  Past Medical History  Diagnosis Date  . Hypertension     History reviewed. No pertinent past surgical history.  No family history on file.  History  Substance Use Topics  . Smoking status: Never Smoker   . Smokeless tobacco: Not on file  . Alcohol Use: Yes    OB History    Grav Para Term Preterm Abortions TAB SAB Ect Mult Living                  Review of Systems Constitutional: Negative for fever and chills.  HENT: POS for ear pain, neg sore throat and trouble swallowing.   Eyes: Negative for pain and visual disturbance.  Respiratory: Negative for cough and shortness of breath.   Cardiovascular: Negative for chest pain and leg swelling.  Gastrointestinal: Negative for nausea, vomiting, abdominal pain and diarrhea.  Genitourinary: Negative for dysuria, urgency and frequency.  Musculoskeletal: Negative for back pain and joint swelling.  Skin: Negative for rash and wound.  Neurological: Negative for dizziness, syncope, speech difficulty, weakness and numbness.   Allergies  Review of patient's allergies indicates no known allergies.  Home Medications   Current Outpatient Rx  Name Route Sig Dispense Refill  . AMLODIPINE BESYLATE 10 MG PO TABS Oral Take 10 mg by mouth daily.    . CEPHALEXIN 500 MG PO CAPS Oral Take 1 capsule (500 mg total) by mouth 4 (four) times daily. 28  capsule 0  . CLONIDINE HCL 0.1 MG/24HR TD PTWK Transdermal Place 1 patch onto the skin once a week. On Tuesdays    . IBUPROFEN 200 MG PO TABS Oral Take 200 mg by mouth every 6 (six) hours as needed. For pain    . METOPROLOL SUCCINATE ER 25 MG PO TB24 Oral Take 25 mg by mouth daily.    Marland Kitchen OVER THE COUNTER MEDICATION Oral Take 1 tablet by mouth daily. OTC Vitamin C with D    . TELMISARTAN-HCTZ 40-12.5 MG PO TABS Oral Take 1 tablet by mouth daily.    Marland Kitchen POLYMYXIN B-TRIMETHOPRIM 10000-0.1 UNIT/ML-% OP SOLN Left Eye Place 1 drop into the left eye every 4 (four) hours. 10 mL 0  . ACETIC ACID 2 % OT SOLN Left Ear Place 4 drops into the left ear 3 (three) times daily. 15 mL 0  . CIPROFLOXACIN-HYDROCORTISONE 0.2-1 % OT SUSP Left Ear Place 3 drops into the left ear 2 (two) times daily. 10 mL 0    BP 149/85  Pulse 55  Temp 98.6 F (37 C) (Oral)  Resp 18  SpO2 98%  Physical Exam Consitutional: Pt in no acute distress.   Head: Normocephalic and atraumatic.  Eyes: Extraocular motion intact, no scleral icterus Neck: Supple without meningismus, mass, or overt JVD Respiratory: Effort normal and breath sounds normal. No respiratory distress. CV: Heart regular rate and regular rhythm (sinus), no obvious murmurs.  Pulses +2 and symmetric Abdomen: Soft, non-tender, non-distended. No rebound or  guarding.  MSK: Extremities are atraumatic without deformity, ROM intact Skin: Warm, dry, intact Neuro: Alert and oriented, no motor deficit noted.   Psychiatric: Mood and affect are normal    ED Course  Procedures (including critical care time)  Labs Reviewed  CBC - Abnormal; Notable for the following:    Platelets 430 (*)     All other components within normal limits  COMPREHENSIVE METABOLIC PANEL - Abnormal; Notable for the following:    Potassium 3.2 (*)     Chloride 94 (*)     CO2 35 (*)     Glucose, Bld 111 (*)     All other components within normal limits  SEDIMENTATION RATE - Abnormal; Notable  for the following:    Sed Rate 31 (*)     All other components within normal limits  ANA  C-REACTIVE PROTEIN   Dg Chest 2 View  02/23/2012  *RADIOLOGY REPORT*  Clinical Data: 56 year old female with left face and ear pain. Possible infection.  Hypertension.  CHEST - 2 VIEW  Comparison: None.  Findings: Mild cardiomegaly. Other mediastinal contours are within normal limits.  Visualized tracheal air column is within normal limits.  No pneumothorax, pleural effusion or confluent pulmonary opacity.  Increased interstitial markings may be related to crowding, pulmonary vascularity appears within normal limits. No acute osseous abnormality identified.  IMPRESSION: No acute cardiopulmonary abnormality.  Original Report Authenticated By: Harley Hallmark, M.D.     1. Otitis externa       MDM  Likely otitis externa. However given her recent presumed diagnosis of viral conjunctivitis, and also given her diffuse swelling of the helix and antihelix of the ear, there may be some concern here for relapsing polychondritis. We'll send screening laboratory values as well as chest x-ray.  Patient has elevated sedimentation rate. C-reactive protein never did return. However, patient remained stable. We'll discharge with acetic acid and Cipro ear drops. Patient will followup with primary care and discuss her recent symptoms. PT DC home stable.  Discussed with pt the clinical impression, treatment in the ED, and follow up plan.  We alslo discussed the indications for returning to the ED, which include shortness or breath, confusion, fever, new weakness or numbness, chest pain, or any other concerning symptom.  The pt understood the treatment and plan, is stable, and is able to leave the ED.          Larrie Kass, MD 02/23/12 [phone]

## 2012-02-23 NOTE — ED Notes (Signed)
Patient transported to X-ray 

## 2012-02-23 NOTE — Discharge Instructions (Signed)
See your doctor. Discuss with her your recent eye symptoms as well. Tell her that your sedimentation rate was elevated.  See your doctor immediately--or return to the ED--with any new or troubling symptoms including fevers, weakness, new chest pain, shortness or breath, numbness, or any other concerning symptom.   Otitis Externa   Otitis externa ("swimmer's ear") is a germ (bacterial) or fungal infection of the outer ear canal (from the eardrum to the outside of the ear). Swimming in dirty water may cause swimmer's ear. It also may be caused by moisture in the ear from water remaining after swimming or bathing. Often the first signs of infection may be itching in the ear canal. This may progress to ear canal swelling, redness, and pus drainage, which may be signs of infection. HOME CARE INSTRUCTIONS   Apply the antibiotic drops to the ear canal as prescribed by your doctor.   This can be a very painful medical condition. A strong pain reliever may be prescribed.   Only take over-the-counter or prescription medicines for pain, discomfort, or fever as directed by your caregiver.   If your caregiver has given you a follow-up appointment, it is very important to keep that appointment. Not keeping the appointment could result in a chronic or permanent injury, pain, hearing loss and disability. If there is any problem keeping the appointment, you must call back to this facility for assistance.  PREVENTION   It is important to keep your ear dry. Use the corner of a towel to wick water out of the ear canal after swimming or bathing.   Avoid scratching in your ear. This can damage the ear canal or remove the protective wax lining the canal and make it easier for germs (bacteria) or a fungus to grow.   You may use ear drops made of rubbing alcohol and vinegar after swimming to prevent future "swimmer's ear" infections. Make up a small bottle of equal parts white vinegar and alcohol. Put 3 or 4 drops into  each ear after swimming.   Avoid swimming in lakes, polluted water, or poorly chlorinated pools.  SEEK MEDICAL CARE IF:   An oral temperature above 102 F (38.9 C) develops.   Your ear is still painful after 3 days and shows signs of getting worse (redness, swelling, pain, or pus).  MAKE SURE YOU:   Understand these instructions.   Will watch your condition.   Will get help right away if you are not doing well or get worse.  Document Released: 08/24/2005 Document Revised: 08/13/2011 Document Reviewed: 03/30/2008 [facility] Patient Information 2012 [place], [place].

## 2012-02-23 NOTE — ED Notes (Signed)
Pt reports was seen here 2 weeks ago from left eye pain/irritation, since then eye has not improved and now has left ear pain and ear swelling. Denying any trouble hearing, no drainage from ear noted. Pt left eye noted to be red, c/o clear drainage and pain to eye. A x 4. NAD

## 2012-02-23 NOTE — ED Provider Notes (Signed)
I saw and evaluated the patient, reviewed the resident's note and I agree with the findings and plan.  L ear pain and swelling x 2 days.  Resolving conjunctivitis from last week. No visual change. Likely otitis externa, however some concern for systemic process given recent presumed viral conjunctivitis.  Glynn Octave, MD 02/23/12 [phone]

## 2012-02-29 ENCOUNTER — Encounter (HOSPITAL_COMMUNITY): Payer: Self-pay | Admitting: *Deleted

## 2012-02-29 ENCOUNTER — Emergency Department (HOSPITAL_COMMUNITY)
Admission: EM | Admit: 2012-02-29 | Discharge: 2012-02-29 | Disposition: A | Discharge status: Home / Self Care | Payer: Self-pay | Source: Home / Self Care

## 2012-02-29 DIAGNOSIS — H6690 Otitis media, unspecified, unspecified ear: Secondary | ICD-10-CM

## 2012-02-29 MED ORDER — AMOXICILLIN 250 MG PO CAPS: 250.0000 mg | ORAL_CAPSULE | Freq: Two times a day (BID) | ORAL | Status: AC

## 2012-02-29 MED ORDER — TRAMADOL HCL 50 MG PO TABS: 50.0000 mg | ORAL_TABLET | Freq: Four times a day (QID) | ORAL | Status: AC | PRN

## 2012-02-29 MED ORDER — AMOXICILLIN 500 MG PO CAPS: 500.0000 mg | ORAL_CAPSULE | Freq: Two times a day (BID) | ORAL | Status: AC

## 2012-02-29 NOTE — ED Provider Notes (Signed)
History     CSN: [id number]  Arrival date & time 02/29/12  1213   First MD Initiated Contact with Patient 02/29/12 1242      No chief complaint on file.   (Consider location/radiation/quality/duration/timing/severity/associated sxs/prior treatment) HPI  Patient is 56 year old female who presents with three-day history of left-sided ear pain, intermittent in nature, sharp when present, 7/10 in severity, no specific aggravating or alleviating symptoms, associated with subjective fevers and chills. Patient reports history of ear infection in last one being several years ago. Patient denies any discharge from the ears, no pain or discharge from the eyes, no nasal congestion or postnasal drip, no sore throat, no chest pain or shortness of breath. Patient also denies recent sick contacts or exposures.   Past Medical History  Diagnosis Date  . Hypertension     No past surgical history on file.  No family history on file.  History  Substance Use Topics  . Smoking status: Never Smoker   . Smokeless tobacco: Not on file  . Alcohol Use: Yes    OB History    Grav Para Term Preterm Abortions TAB SAB Ect Mult Living                  Review of Systems  Review of Systems  Constitutional: Negative for fever, chills, diaphoresis, activity change, appetite change and fatigue.  HENT: Negative for nosebleeds, congestion, facial swelling, rhinorrhea, neck pain, neck stiffness and ear discharge.   Eyes: Negative for pain, discharge, redness, itching and visual disturbance.  Respiratory: Negative for cough, choking, chest tightness, shortness of breath, wheezing and stridor.   Cardiovascular: Negative for chest pain, palpitations and leg swelling.  Gastrointestinal: Negative for abdominal distention.  Genitourinary: Negative for dysuria, urgency, frequency, hematuria, flank pain, decreased urine volume, difficulty urinating and dyspareunia.  Musculoskeletal: Negative for back pain, joint  swelling, arthralgias and gait problem.  Neurological: Negative for dizziness, tremors, seizures, syncope, facial asymmetry, speech difficulty, weakness, light-headedness, numbness and headaches.  Hematological: Negative for adenopathy. Does not bruise/bleed easily.  Psychiatric/Behavioral: Negative for hallucinations, behavioral problems, confusion, dysphoric mood, decreased concentration and agitation.    Allergies  Review of patient's allergies indicates no known allergies.  Home Medications   Current Outpatient Rx  Name Route Sig Dispense Refill  . ACETIC ACID 2 % OT SOLN Left Ear Place 4 drops into the left ear 3 (three) times daily. 15 mL 0  . AMLODIPINE BESYLATE 10 MG PO TABS Oral Take 10 mg by mouth daily.    Marland Kitchen CIPROFLOXACIN-HYDROCORTISONE 0.2-1 % OT SUSP Left Ear Place 3 drops into the left ear 2 (two) times daily. 10 mL 0  . CLONIDINE HCL 0.1 MG/24HR TD PTWK Transdermal Place 1 patch onto the skin once a week. On Tuesdays    . IBUPROFEN 200 MG PO TABS Oral Take 200 mg by mouth every 6 (six) hours as needed. For pain    . METOPROLOL SUCCINATE ER 25 MG PO TB24 Oral Take 25 mg by mouth daily.    Marland Kitchen OVER THE COUNTER MEDICATION Oral Take 1 tablet by mouth daily. OTC Vitamin C with D    . TELMISARTAN-HCTZ 40-12.5 MG PO TABS Oral Take 1 tablet by mouth daily.      There were no vitals taken for this visit.  Physical Exam  Physical Exam  Constitutional: She appears well-developed and well-nourished. No distress.  HENT:  Head: Normocephalic.  Right Ear: External ear normal.  Left Ear: External ear normal. Tympanic membrane erythematous  and swollen, poorly visualized in her ear structures due to swelling, small amount of blood noted, significant tenderness with examination. Nose: Nose normal.  Mouth/Throat: Oropharynx is clear and moist.  Eyes: Conjunctivae and EOM are normal. Pupils are equal, round, and reactive to light. Right eye exhibits no discharge. Left eye exhibits no  discharge. No scleral icterus.  Neck: Normal range of motion. Neck supple. No JVD present. No tracheal deviation present. No thyromegaly present.  Cardiovascular: Normal rate, regular rhythm, normal heart sounds and intact distal pulses.  Exam reveals no gallop and no friction rub.   Lymphadenopathy: She has no cervical adenopathy.  Skin: Skin is warm and dry. No rash noted. She is not diaphoretic. No erythema. No pallor.    ED Course  Procedures (including critical care time)  Labs Reviewed - No data to display No results found.   Ear infection - On the left side, patient's symptoms and physical exam findings consistent with left-sided ear infection - I have advised patient to avoid use of Q-tips - We will prescribe course of antibiotic for 7 days - Patient was advised if her symptoms get worse she needs to followup with primary care physician    MDM  Left-sided ear infection, needs treatment with antibiotic for 7 days        Dorothea Ogle, MD 02/29/12 1405

## 2012-02-29 NOTE — ED Notes (Signed)
Pt  Reports  Symptoms  Of  l  Earache  X  10  Days  She  Reports       Symptoms  Of  Bilateral  Eye  Irritation as  Well  Which    She  Was  Treated  With  Anti  Biotics   She  Reports

## 2012-02-29 NOTE — Discharge Instructions (Signed)
Otitis Media, Adult  A middle ear infection is an infection in the space behind the eardrum. The medical name for this is "otitis media." It may happen after a common cold. It is caused by a germ that starts growing in that space. You may feel swollen glands in your neck on the side of the ear infection.  HOME CARE INSTRUCTIONS   · Take your medicine as directed until it is gone, even if you feel better after the first few days.  · Only take over-the-counter or prescription medicines for pain, discomfort, or fever as directed by your caregiver.  · Occasional use of a nasal decongestant a couple times per day may help with discomfort and help the eustachian tube to drain better.  Follow up with your caregiver in 10 to 14 days or as directed, to be certain that the infection has cleared. Not keeping the appointment could result in a chronic or permanent injury, pain, hearing loss and disability. If there is any problem keeping the appointment, you must call back to this facility for assistance.  SEEK IMMEDIATE MEDICAL CARE IF:   · You are not getting better in 2 to 3 days.  · You have pain that is not controlled with medication.  · You feel worse instead of better.  · You cannot use the medication as directed.  · You develop swelling, redness or pain around the ear or stiffness in your neck.  MAKE SURE YOU:   · Understand these instructions.  · Will watch your condition.  · Will get help right away if you are not doing well or get worse.  Document Released: 05/29/2004 Document Revised: 08/13/2011 Document Reviewed: 03/30/2008  [employer]® Patient Information ©2012 [employer].

## 2012-04-20 ENCOUNTER — Emergency Department (INDEPENDENT_AMBULATORY_CARE_PROVIDER_SITE_OTHER)
Admission: EM | Admit: 2012-04-20 | Discharge: 2012-04-20 | Disposition: A | Discharge status: Home / Self Care | Payer: Self-pay | Source: Home / Self Care | Attending: Emergency Medicine | Admitting: Emergency Medicine

## 2012-04-20 ENCOUNTER — Encounter (HOSPITAL_COMMUNITY): Payer: Self-pay | Admitting: Emergency Medicine

## 2012-04-20 DIAGNOSIS — I10 Essential (primary) hypertension: Secondary | ICD-10-CM

## 2012-04-20 DIAGNOSIS — H61003 Unspecified perichondritis of external ear, bilateral: Secondary | ICD-10-CM

## 2012-04-20 DIAGNOSIS — H61009 Unspecified perichondritis of external ear, unspecified ear: Secondary | ICD-10-CM

## 2012-04-20 DIAGNOSIS — E876 Hypokalemia: Secondary | ICD-10-CM

## 2012-04-20 LAB — POCT I-STAT, CHEM 8
BUN: 9 mg/dL (ref 6–23)
Chloride: 100 mEq/L (ref 96–112)
Creatinine, Ser: 0.7 mg/dL (ref 0.50–1.10)
Potassium: 3.2 mEq/L — ABNORMAL LOW (ref 3.5–5.1)
Sodium: 141 mEq/L (ref 135–145)
TCO2: 28 mmol/L (ref 0–100)

## 2012-04-20 MED ORDER — METOPROLOL SUCCINATE ER 25 MG PO TB24: 25.0000 mg | ORAL_TABLET | Freq: Every day | ORAL | Status: DC

## 2012-04-20 MED ORDER — AMLODIPINE BESYLATE 10 MG PO TABS: 10.0000 mg | ORAL_TABLET | Freq: Every day | ORAL | Status: DC

## 2012-04-20 MED ORDER — POTASSIUM CHLORIDE CRYS ER 20 MEQ PO TBCR: 20.0000 meq | EXTENDED_RELEASE_TABLET | Freq: Two times a day (BID) | ORAL | Status: DC

## 2012-04-20 MED ORDER — POTASSIUM CHLORIDE CRYS ER 20 MEQ PO TBCR: 20.0000 meq | EXTENDED_RELEASE_TABLET | Freq: Every day | ORAL | Status: DC

## 2012-04-20 MED ORDER — TELMISARTAN-HCTZ 40-12.5 MG PO TABS: 1.0000 | ORAL_TABLET | Freq: Every day | ORAL | Status: DC

## 2012-04-20 MED ORDER — CLONIDINE HCL 0.1 MG/24HR TD PTWK: 1.0000 | MEDICATED_PATCH | TRANSDERMAL | Status: DC

## 2012-04-20 MED ORDER — CIPROFLOXACIN HCL 500 MG PO TABS: 500.0000 mg | ORAL_TABLET | Freq: Two times a day (BID) | ORAL | Status: AC

## 2012-04-20 NOTE — ED Provider Notes (Signed)
History     CSN: [id number]  Arrival date & time 04/20/12  1222   First MD Initiated Contact with Patient 04/20/12 1239      Chief Complaint  Patient presents with  . Otalgia    (Consider location/radiation/quality/duration/timing/severity/associated sxs/prior treatment) HPI Comments: Patient presents with 2 complaints: First, she reports bilateral external ear swelling, tenderness for the past week. No recent trauma, known bug bites, sunburn. No nausea, vomiting, otorrhea, change in hearing. Similar symptoms twice earlier this summer. 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 She was found to have elevated CRP, ESR, but ANA on 6/18 was negative. She was thought to have otitis externa, and treated with acetic ear drops and topical fluoroquinolone. She returned one week later with persistent symptoms, was sent home on 1 week of an unknown po antibiotic which completely resolved her sx. She is not a diabetic.  Second, she reports being out of all of her blood pressure medications for the past 2-1/2 weeks. She is a [facility] patient, and has been unable to get her medications filled since [facility] closed. She reports intermittent frontal headaches consistent with headaches that she gets her blood pressure is elevated, and she reports about 10 days of bilateral conjunctival hyperemia. No nausea, vomiting, visual changes. No foreign body sensation, excessive tearing, photophobia, halos around lights, eye drainage. Patient denies focal paresis, new onset seizure activity, dysarthria. She denies  chest pain, shortness of breath, palpitations, pain tearing through to her back, anuria, hematuria, lower extremity swelling. Pt denies illicit drug use, most notably cocaine, or recent use of OTC medications such as nasal decongestants   ROS as noted in HPI. All other ROS negative.   Patient is a 56 y.o. female presenting with ear pain and hypertension. The history is provided by the  patient. No language interpreter was used.  Otalgia This is a recurrent problem. The current episode started more than 2 days ago. There is pain in both ears. The problem occurs constantly. The problem has not changed since onset.There has been no fever. Associated symptoms include headaches. Pertinent negatives include no ear discharge, no hearing loss, no rhinorrhea, no sore throat, no abdominal pain, no diarrhea, no vomiting, no neck pain, no cough and no rash.  Hypertension This is a chronic problem. The current episode started more than 1 week ago. The problem occurs constantly. The problem has not changed since onset.Associated symptoms include headaches. Pertinent negatives include no chest pain and no abdominal pain. Nothing aggravates the symptoms. Nothing relieves the symptoms. The treatment provided no relief.    Past Medical History  Diagnosis Date  . Hypertension     History reviewed. No pertinent past surgical history.  History reviewed. No pertinent family history.  History  Substance Use Topics  . Smoking status: Never Smoker   . Smokeless tobacco: Not on file  . Alcohol Use: Yes    OB History    Grav Para Term Preterm Abortions TAB SAB Ect Mult Living                  Review of Systems  HENT: Positive for ear pain. Negative for hearing loss, sore throat, rhinorrhea, neck pain and ear discharge.   Respiratory: Negative for cough.   Cardiovascular: Negative for chest pain.  Gastrointestinal: Negative for vomiting, abdominal pain and diarrhea.  Skin: Negative for rash.  Neurological: Positive for headaches.    Allergies  Review of patient's allergies indicates no known allergies.  Home Medications  Current Outpatient Rx  Name Route Sig Dispense Refill  . AMLODIPINE BESYLATE 10 MG PO TABS Oral Take 1 tablet (10 mg total) by mouth daily. 30 tablet 2  . CIPROFLOXACIN HCL 500 MG PO TABS Oral Take 1 tablet (500 mg total) by mouth 2 (two) times daily. X 1 week  14 tablet 0  . CLONIDINE HCL 0.1 MG/24HR TD PTWK Transdermal Place 1 patch (0.1 mg total) onto the skin once a week. On Tuesdays 4 patch 2  . IBUPROFEN 200 MG PO TABS Oral Take 200 mg by mouth every 6 (six) hours as needed. For pain    . METOPROLOL SUCCINATE ER 25 MG PO TB24 Oral Take 1 tablet (25 mg total) by mouth daily. 30 tablet 2  . OVER THE COUNTER MEDICATION Oral Take 1 tablet by mouth daily. OTC Vitamin C with D    . POTASSIUM CHLORIDE CRYS ER 20 MEQ PO TBCR Oral Take 1 tablet (20 mEq total) by mouth daily. 30 tablet 0  . TELMISARTAN-HCTZ 40-12.5 MG PO TABS Oral Take 1 tablet by mouth daily. 30 tablet 2    BP 192/107  Pulse 75  Temp 98.1 F (36.7 C) (Oral)  Resp 18  SpO2 96% BP Readings from Last 3 Encounters:  04/20/12 192/107  02/29/12 154/77  02/23/12 149/85    Physical Exam  Nursing note and vitals reviewed. Constitutional: She is oriented to person, place, and time. She appears well-developed and well-nourished. No distress.  HENT:  Head: Normocephalic and atraumatic.  Right Ear: Hearing, tympanic membrane and ear canal normal. There is swelling and tenderness.  Left Ear: Hearing, tympanic membrane and ear canal normal. There is swelling and tenderness.  Ears:  Nose: Nose normal. Right sinus exhibits no maxillary sinus tenderness and no frontal sinus tenderness. Left sinus exhibits no maxillary sinus tenderness and no frontal sinus tenderness.  Mouth/Throat: Uvula is midline, oropharynx is clear and moist and mucous membranes are normal.       Bilateral tenderness, swelling on pinna. See drawing. No signs of trauma. No otitis externa, mastoiditis noted.  Eyes: EOM and lids are normal. Pupils are equal, round, and reactive to light. Right eye exhibits no exudate. Left eye exhibits no exudate. Right conjunctiva is injected. Left conjunctiva is injected.  Fundoscopic exam:      The right eye shows no hemorrhage and no papilledema.       The left eye shows no hemorrhage  and no papilledema.       L eye 20/70, R eye 20/25.  pt states this is normal for her  Neck: Normal range of motion.  Cardiovascular: Normal rate, regular rhythm and normal heart sounds.   Pulmonary/Chest: Effort normal and breath sounds normal.  Abdominal: Soft. Bowel sounds are normal. She exhibits no distension.  Musculoskeletal: Normal range of motion. She exhibits no edema.  Lymphadenopathy:    She has no cervical adenopathy.  Neurological: She is alert and oriented to person, place, and time. She has normal strength. She displays no tremor. No cranial nerve deficit or sensory deficit. She displays a negative Romberg sign. Coordination and gait normal.       Finger->nose, heel-> shin WNL. Tandem gait steady.   Skin: Skin is warm and dry.  Psychiatric: She has a normal mood and affect. Her behavior is normal. Judgment and thought content normal.    ED Course  Procedures (including critical care time)  Labs Reviewed  POCT I-STAT, CHEM 8 - Abnormal; Notable for the following:  Potassium 3.2 (*)     Glucose, Bld 102 (*)     All other components within normal limits   No results found.   1. Hypertension   2. Perichondritis of both external ears   3. Hypokalemia      Results for orders placed during the hospital encounter of 04/20/12  POCT I-STAT, CHEM 8      Component Value Range   Sodium 141  135 - 145 mEq/L   Potassium 3.2 (*) 3.5 - 5.1 mEq/L   Chloride 100  96 - 112 mEq/L   BUN 9  6 - 23 mg/dL   Creatinine, Ser 6.29  0.50 - 1.10 mg/dL   Glucose, Bld 528 (*) 70 - 99 mg/dL   Calcium, Ion 4.13  2.44 - 1.23 mmol/L   TCO2 28  0 - 100 mmol/L   Hemoglobin 15.0  12.0 - 15.0 g/dL   HCT 01.0  27.2 - 53.6 %    MDM   Patient seems to have a recurrent perichondritis. Given that the patient improved with antibiotics last time, will treat this as an infectious perichondritis, sending home with Cipro by mouth for one week. Advised patient that she'll need to followup with  rheumatology if this comes back a second time.  blood pressure noted.patient has not taken her medication in weeks. Suspect that the bilateral conjunctival injection is related to her blood pressure, and not an infectious process. However,  patient has no new or visual changes. Headache is not new or different from previous headaches. No strokelike symptoms. She is neurologically intact. Denies any red flags concerning for hypertensive emergency. Will restart all of her blood pressure medications, and refer her to local primary care resources. I-STAT shows mild hypokalemia despite being off of her hydrochlorothiazide for several weeks. well start her on 20 mEq of potassium daily in addition to restarting her Micardis hydrochlorothiazide. Will have her return here in a week for blood pressure recheck if she is unable to locate a primary care physician by then. Discussed signs and symptoms that should prompt her immediate return to the department. Patient agrees with plan.  Luiz Blare, MD 04/20/12 2130

## 2012-04-20 NOTE — ED Notes (Signed)
Pt. Stated, I've started having my ears to hurt and swell for about week half and my eyes are burning. I'm also out of my BP med. For 2 1/2 weeks and I've had HA off and on for 2 weeks.  i go to [facility] and suppose to get a 90 refill but they have not gotten it to me yet.

## 2012-05-23 ENCOUNTER — Emergency Department (HOSPITAL_COMMUNITY)
Admission: EM | Admit: 2012-05-23 | Discharge: 2012-05-23 | Disposition: A | Discharge status: Home / Self Care | Payer: Self-pay | Attending: Emergency Medicine | Admitting: Emergency Medicine

## 2012-05-23 ENCOUNTER — Encounter (HOSPITAL_COMMUNITY): Payer: Self-pay | Admitting: Emergency Medicine

## 2012-05-23 DIAGNOSIS — H5710 Ocular pain, unspecified eye: Secondary | ICD-10-CM | POA: Insufficient documentation

## 2012-05-23 DIAGNOSIS — H2000 Unspecified acute and subacute iridocyclitis: Secondary | ICD-10-CM

## 2012-05-23 DIAGNOSIS — I10 Essential (primary) hypertension: Secondary | ICD-10-CM | POA: Insufficient documentation

## 2012-05-23 MED ORDER — TETRACAINE HCL 0.5 % OP SOLN
2.0000 [drp] | Freq: Once | OPHTHALMIC | Status: AC
  Administered 2012-05-23: 2 [drp] via OPHTHALMIC
  Filled 2012-05-23: qty 2

## 2012-05-23 MED ORDER — PREDNISOLONE ACETATE 1 % OP SUSP
1.0000 [drp] | Freq: Four times a day (QID) | OPHTHALMIC | Status: DC
  Administered 2012-05-23: 1 [drp] via OPHTHALMIC
  Filled 2012-05-23: qty 1

## 2012-05-23 MED ORDER — FLUORESCEIN SODIUM 1 MG OP STRP
1.0000 | ORAL_STRIP | Freq: Once | OPHTHALMIC | Status: AC
  Administered 2012-05-23: 1 via OPHTHALMIC
  Filled 2012-05-23: qty 1

## 2012-05-23 NOTE — ED Notes (Signed)
Pt c/o right eye pain and redness x 2 week; pt denies change in vision

## 2012-05-23 NOTE — ED Provider Notes (Signed)
History     CSN: [id number]  Arrival date & time 05/23/12  1453   First MD Initiated Contact with Patient 05/23/12 1534      Chief Complaint  Patient presents with  . Eye Pain     HPI Pt was seen at 1555.  Per pt, c/o gradual onset and persistence of constant right eye "redness" for the past 2 weeks.  Has been associated with mild "tearing" and "pain" when in bright light.  Describes the pain as "burning."  Denies eye drainage, no headache, no visual changes, no rash, no fevers, no injury.     Past Medical History  Diagnosis Date  . Hypertension     History reviewed. No pertinent past surgical history.    History  Substance Use Topics  . Smoking status: Never Smoker   . Smokeless tobacco: Not on file  . Alcohol Use: Yes      Review of Systems ROS: Statement: All systems negative except as marked or noted in the HPI; Constitutional: Negative for fever and chills. ; ; Eyes: +right eye redness, pain to light.  Negative for discharge. ; ; ENMT: Negative for ear pain, hoarseness, nasal congestion, sinus pressure and sore throat. ; ; Cardiovascular: Negative for chest pain, palpitations, diaphoresis, dyspnea and peripheral edema. ; ; Respiratory: Negative for cough, wheezing and stridor. ; ; Gastrointestinal: Negative for nausea, vomiting, diarrhea, abdominal pain, blood in stool, hematemesis, jaundice and rectal bleeding. . ; ; Genitourinary: Negative for dysuria, flank pain and hematuria. ; ; Musculoskeletal: Negative for back pain and neck pain. Negative for swelling and trauma.; ; Skin: Negative for pruritus, rash, abrasions, blisters, bruising and skin lesion.; ; Neuro: Negative for headache, lightheadedness and neck stiffness. Negative for weakness, altered level of consciousness , altered mental status, extremity weakness, paresthesias, involuntary movement, seizure and syncope.       Allergies  Review of patient's allergies indicates no known allergies.  Home  Medications   Current Outpatient Rx  Name Route Sig Dispense Refill  . AMLODIPINE BESYLATE 10 MG PO TABS Oral Take 1 tablet (10 mg total) by mouth daily. 30 tablet 2  . CLONIDINE HCL 0.1 MG/24HR TD PTWK Transdermal Place 1 patch (0.1 mg total) onto the skin once a week. On Tuesdays 4 patch 2  . IBUPROFEN 200 MG PO TABS Oral Take 200 mg by mouth every 6 (six) hours as needed. For pain    . METOPROLOL SUCCINATE ER 25 MG PO TB24 Oral Take 1 tablet (25 mg total) by mouth daily. 30 tablet 2  . OVER THE COUNTER MEDICATION Oral Take 1 tablet by mouth daily. OTC Vitamin C with D    . POTASSIUM CHLORIDE CRYS ER 20 MEQ PO TBCR Oral Take 1 tablet (20 mEq total) by mouth daily. 30 tablet 0  . TELMISARTAN-HCTZ 40-12.5 MG PO TABS Oral Take 1 tablet by mouth daily. 30 tablet 2    BP 108/75  Pulse 64  Temp 97.8 F (36.6 C) (Oral)  Resp 18  SpO2 98%  Physical Exam 1600: Physical examination:  Nursing notes reviewed; Vital signs and O2 SAT reviewed;  Constitutional: Well developed, Well nourished, Well hydrated, In no acute distress; Head:  Normocephalic, atraumatic; Eyes: EOMI, PERRL, No scleral icterus; Eye Exam: Right pupil: Size: 3 mm; Findings: Normal, reactive; Left pupil: Size: 3 mm; Findings: Normal, reactive; Extraocular movement: Bilateral normal, No nystagmus. ; Eyelid: Bilateral normal, No edema.  No ptosis.  No FB identified right eye. ; Conjunctiva and sclera: +right  conjunctival injection, no chemosis, no discharge.  No obvious hyphema or hypopion.  ; Cornea and anterior chamber: Right normal, Anterior chamber without cell and flair.  Fluorescein stain right eye: negative for corneal abrasion, no corneal ulcer, neg Seidel's.; ; Diagnostic medications: Right fluorescein, Bilateral proparacaine; Diagnostic instrument: Slit lamp, Tonopen; IOP Right: 30 mm H20; IOP Left : 30 mm H20;; Visual acuity right: 20/ 25; Visual acuity left: 20/40;;; ENMT: Mouth and pharynx normal, Mucous membranes moist; Neck:  Supple, Full range of motion, No lymphadenopathy; Cardiovascular: Regular rate and rhythm, No murmur, rub, or gallop; Respiratory: Breath sounds clear & equal bilaterally, No rales, rhonchi, wheezes.  Speaking full sentences with ease, Normal respiratory effort/excursion; Chest: Nontender, Movement normal;; Extremities: Pulses normal, No tenderness, No edema, No calf edema or asymmetry.; Neuro: AA&Ox3, Major CN grossly intact.  Speech clear. No gross focal motor or sensory deficits in extremities.; Skin: Color normal, Warm, Dry.    ED Course  Procedures    MDM  MDM Reviewed: nursing note and vitals     1705:  T/C to Ophtho Dr. Karleen Hampshire, case discussed, including:  HPI, pertinent PM/SHx, VS/PE, dx testing, ED course and treatment:  Likely dx is iritis, requests to start pred-forte 1 gtt right eye QID x7 days, have pt f/u in ofc in 3 days.  Dx testing d/w pt and family.  Questions answered.  Verb understanding, agreeable to d/c home with outpt f/u with Ophtho MD within the next 3 days.       Laray Anger, DO 05/27/12 [phone]

## 2012-05-30 ENCOUNTER — Emergency Department (HOSPITAL_COMMUNITY)
Admission: EM | Admit: 2012-05-30 | Discharge: 2012-05-30 | Disposition: A | Discharge status: Home / Self Care | Payer: Self-pay | Attending: Emergency Medicine | Admitting: Emergency Medicine

## 2012-05-30 ENCOUNTER — Encounter (HOSPITAL_COMMUNITY): Payer: Self-pay | Admitting: Vascular Surgery

## 2012-05-30 DIAGNOSIS — H209 Unspecified iridocyclitis: Secondary | ICD-10-CM | POA: Insufficient documentation

## 2012-05-30 DIAGNOSIS — I10 Essential (primary) hypertension: Secondary | ICD-10-CM | POA: Insufficient documentation

## 2012-05-30 MED ORDER — TETRACAINE HCL 0.5 % OP SOLN
2.0000 [drp] | Freq: Once | OPHTHALMIC | Status: AC
  Administered 2012-05-30: 2 [drp] via OPHTHALMIC
  Filled 2012-05-30: qty 2

## 2012-05-30 MED ORDER — CYCLOPENTOLATE HCL 1 % OP SOLN
2.0000 [drp] | Freq: Once | OPHTHALMIC | Status: AC
  Administered 2012-05-30: 2 [drp] via OPHTHALMIC
  Filled 2012-05-30: qty 2

## 2012-05-30 NOTE — ED Notes (Addendum)
Pt is reporting right eye infection pt is reporting pain and increasing blurred vision. Was seen here last week for the same. Was dc with prescription eye drops. Reports it was helping until Saturday then it has gotten increasingly worse. Was taking it as prescribed. Swelling and redness noted to right eye. She has an appt to see a specialist this Friday but she couldn't take the pain anymore.

## 2012-05-30 NOTE — ED Provider Notes (Signed)
History  This chart was scribed for Malisha Mabey B. Bernette Mayers, MD by Shari Heritage. The patient was seen in room TR02C/TR02C. Patient's care was started at 1222.     CSN: [id number]  Arrival date & time 05/30/12  1128   First MD Initiated Contact with Patient 05/30/12 1222      Chief Complaint  Patient presents with  . Eye Problem     The history is provided by the patient. No language interpreter was used.    Kylie Harris is a 56 y.o. female who presents to the Emergency Department complaining of persistent, moderate to severe, constant right eye pain that radiates up to her head onset 1-2 weeks ago. There is associated eye swelling. Patient was seen for the same on 05/23/12 here by Dr. Clarene Duke. She was treated for an eye infection with prednisolone acetate and patient says that she has been using drops as instructed. Patient said that her eye seemed to get better after a few says, but started to worsen again on Saturday. Patient made an appointment with Dr. Karleen Hampshire, an eye specialist, who was referred to her during her last visit, but the appointment is not until this Friday.   Past Medical History  Diagnosis Date  . Hypertension     Past Surgical History  Procedure Date  . Partial hysterectomy     Family History  Problem Relation Age of Onset  . Hypertension Mother   . Heart failure Mother     History  Substance Use Topics  . Smoking status: Never Smoker   . Smokeless tobacco: Not on file  . Alcohol Use: Yes     states, "every now and then"    OB History    Grav Para Term Preterm Abortions TAB SAB Ect Mult Living   2    2  2    0      Review of Systems A complete 10 system review of systems was obtained and all systems are negative except as noted in the HPI and PMH.   Allergies  Review of patient's allergies indicates no known allergies.  Home Medications   Current Outpatient Rx  Name Route Sig Dispense Refill  . AMLODIPINE BESYLATE 10 MG PO TABS Oral Take  1 tablet (10 mg total) by mouth daily. 30 tablet 2  . CLONIDINE HCL 0.1 MG/24HR TD PTWK Transdermal Place 1 patch (0.1 mg total) onto the skin once a week. On Tuesdays 4 patch 2  . METOPROLOL SUCCINATE ER 25 MG PO TB24 Oral Take 1 tablet (25 mg total) by mouth daily. 30 tablet 2  . TELMISARTAN-HCTZ 40-12.5 MG PO TABS Oral Take 1 tablet by mouth daily. 30 tablet 2  . VITAMIN C 500 MG PO TABS Oral Take 500 mg by mouth daily.    . IBUPROFEN 200 MG PO TABS Oral Take 200 mg by mouth every 6 (six) hours as needed. For pain      BP 118/74  Pulse 65  Temp 98.3 F (36.8 C)  Resp 18  SpO2 99%  Physical Exam  Nursing note and vitals reviewed. Constitutional: She is oriented to person, place, and time. She appears well-developed and well-nourished.  HENT:  Head: Normocephalic and atraumatic.  Eyes: EOM are normal. Pupils are equal, round, and reactive to light. Left conjunctiva is injected.       Right eye conjunctiva is swollen, erythematous and injected. Anterior chamber is clear. Pain with pupillary response on the right and left.  Neck: Normal range of  motion. Neck supple.  Cardiovascular: Normal rate, normal heart sounds and intact distal pulses.   Pulmonary/Chest: Effort normal and breath sounds normal.  Abdominal: Bowel sounds are normal. She exhibits no distension. There is no tenderness.  Musculoskeletal: Normal range of motion. She exhibits no edema and no tenderness.  Neurological: She is alert and oriented to person, place, and time. She has normal strength. No cranial nerve deficit or sensory deficit.  Skin: Skin is warm and dry. No rash noted.  Psychiatric: She has a normal mood and affect.    ED Course  Procedures (including critical care time) DIAGNOSTIC STUDIES: Oxygen Saturation is 99% on room air, normal by my interpretation.    COORDINATION OF CARE: 12:46pm- Patient informed of current plan for treatment and evaluation and agrees with plan at this time. Will contact Dr.  Jilda Roche office to try to get patient seen this afternoon.   Labs Reviewed - No data to display No results found.   1. Iritis       MDM  Pt seen last week for same, felt to be iritis, given Ophtho follow up which is scheduled for later this week. Given tetracaine and cyclygyl drops for pain relief. Discussed with Dr. Karleen Hampshire who will see her in his office this afternoon.       I personally performed the services described in the documentation, which were scribed in my presence. The recorded information has been reviewed and considered.     Tashauna Caisse B. Bernette Mayers, MD 05/30/12 1332

## 2012-05-30 NOTE — ED Notes (Signed)
Pt arrives to the Ed via [facility] for eval of right eye infection. Is reporting blurred vision and eye pain. Was seen here a week ago and was d/c with prednisolone acetate. Reports that she has been using her eye drops as prescribed. She states that it seems like it was doing better but began to get worse on Saturday and her vision has been getting worse ever since. Also reports increasing swelling and pain.

## 2012-06-27 ENCOUNTER — Encounter (HOSPITAL_COMMUNITY): Payer: Self-pay | Admitting: Respiratory Therapy

## 2012-06-29 ENCOUNTER — Other Ambulatory Visit: Payer: Self-pay | Admitting: Ophthalmology

## 2012-07-07 ENCOUNTER — Encounter (HOSPITAL_COMMUNITY): Payer: Self-pay | Admitting: *Deleted

## 2012-07-07 MED ORDER — MUPIROCIN 2 % EX OINT
TOPICAL_OINTMENT | Freq: Two times a day (BID) | CUTANEOUS | Status: DC
  Administered 2012-07-08: 1 via NASAL
  Filled 2012-07-07 (×2): qty 22

## 2012-07-07 NOTE — Progress Notes (Signed)
I called Dr Ephriam Knuckles office and asked to have him sign the orders.

## 2012-07-07 NOTE — Progress Notes (Signed)
Denies having a cardiologist, has not had a 2Decho or stress test.

## 2012-07-07 NOTE — H&P (Signed)
Kylie Harris is an 56 y.o. female.   Chief Complaint: VISION LOSS RIGHT EYE, RETINAL DETACHMENT HPI: SEVERAL WEEKS OF VISION LOSS, WITH A HISTORY OF UNTREATED NARROW ANGLE GLAUCOMA, PHAKIC RIGHT EYE.    Past Medical History  Diagnosis Date  . Hypertension     Past Surgical History  Procedure Date  . Partial hysterectomy   . Eye surgery     lazer-bil    Family History  Problem Relation Age of Onset  . Hypertension Mother   . Heart failure Mother    Social History:  reports that she has never smoked. She does not have any smokeless tobacco history on file. She reports that she drinks about 1.2 ounces of alcohol per week. She reports that she does not use illicit drugs.  Allergies: No Known Allergies  No prescriptions prior to admission    No results found for this or any previous visit (from the past 48 hour(s)). No results found.  Review of Systems  Constitutional: Negative.   HENT: Negative.   Eyes: Positive for blurred vision.  Respiratory: Negative.   Cardiovascular: Negative.   Gastrointestinal: Negative.   Musculoskeletal: Negative.   Skin: Negative.   Neurological: Negative.   Endo/Heme/Allergies: Negative.   Psychiatric/Behavioral: Negative.     There were no vitals taken for this visit. Physical Exam  Constitutional: She is oriented to person, place, and time. Vital signs are normal. She appears well-developed and well-nourished.  HENT:  Head: Normocephalic and atraumatic.  Eyes: Conjunctivae normal, EOM and lids are normal. Pupils are equal, round, and reactive to light. No foreign bodies found.         Vision hand motion, right eye, 20/30 left eye.  iop 23 od,  Neck: Trachea normal, normal range of motion and phonation normal. Neck supple.  Cardiovascular: Regular rhythm.  Bradycardia present.   Respiratory: Effort normal.  GI: Soft. Normal appearance.  Musculoskeletal: Normal range of motion.  Neurological: She is alert and oriented to person,  place, and time. She has normal reflexes.  Skin: Skin is warm and dry.  Psychiatric: She has a normal mood and affect. Her speech is normal and behavior is normal. Judgment and thought content normal.     Assessment/Plan RHEGMATOGENOUS RETINAL DETACHMENT RIGHT EYE, PHAKIC.  PLAN IS SURGICAL REPAIR VIA SCLERAL BUCKLE CRYOPEXY, RIGHT EYE.  Kylie Harris A 07/07/2012, 9:06 PM

## 2012-07-08 ENCOUNTER — Encounter (HOSPITAL_COMMUNITY)
Admission: RE | Disposition: A | Discharge status: Home / Self Care | Payer: Self-pay | Source: Ambulatory Visit | Attending: Ophthalmology

## 2012-07-08 ENCOUNTER — Ambulatory Visit (HOSPITAL_COMMUNITY): Admitting: Anesthesiology

## 2012-07-08 ENCOUNTER — Encounter (HOSPITAL_COMMUNITY): Payer: Self-pay | Admitting: Anesthesiology

## 2012-07-08 ENCOUNTER — Ambulatory Visit (HOSPITAL_COMMUNITY)
Admission: RE | Admit: 2012-07-08 | Discharge: 2012-07-08 | Disposition: A | Discharge status: Home / Self Care | Source: Ambulatory Visit | Attending: Ophthalmology | Admitting: Ophthalmology

## 2012-07-08 DIAGNOSIS — H33009 Unspecified retinal detachment with retinal break, unspecified eye: Secondary | ICD-10-CM

## 2012-07-08 DIAGNOSIS — I10 Essential (primary) hypertension: Secondary | ICD-10-CM | POA: Insufficient documentation

## 2012-07-08 DIAGNOSIS — H33059 Total retinal detachment, unspecified eye: Secondary | ICD-10-CM | POA: Insufficient documentation

## 2012-07-08 HISTORY — PX: SCLERAL BUCKLE WITH CRYO: SHX5341

## 2012-07-08 LAB — SURGICAL PCR SCREEN
MRSA, PCR: NEGATIVE
Staphylococcus aureus: NEGATIVE

## 2012-07-08 LAB — BASIC METABOLIC PANEL
CO2: 29 mEq/L (ref 19–32)
Chloride: 100 mEq/L (ref 96–112)
GFR calc Af Amer: >90 mL/min (ref >90)
Potassium: 4.1 mEq/L (ref 3.5–5.1)
Sodium: 137 mEq/L (ref 135–145)

## 2012-07-08 LAB — CBC
HCT: 37.8 % (ref 36.0–46.0)
Hemoglobin: 12.4 g/dL (ref 12.0–15.0)
MCV: 81.3 fL (ref 78.0–100.0)
RBC: 4.65 MIL/uL (ref 3.87–5.11)
RDW: 14.2 % (ref 11.5–15.5)
WBC: 6.7 10*3/uL (ref 4.0–10.5)

## 2012-07-08 SURGERY — SCLERAL BUCKLE WITH CRYO
Anesthesia: General | Site: Eye | Laterality: Right | Wound class: Clean

## 2012-07-08 MED ORDER — BSS PLUS IO SOLN
INTRAOCULAR | Status: AC
  Filled 2012-07-08: qty 500

## 2012-07-08 MED ORDER — FENTANYL CITRATE 0.05 MG/ML IJ SOLN
INTRAMUSCULAR | Status: DC | PRN
  Administered 2012-07-08: 50 ug via INTRAVENOUS
  Administered 2012-07-08 (×2): 100 ug via INTRAVENOUS

## 2012-07-08 MED ORDER — GENTAMICIN SULFATE 40 MG/ML IJ SOLN
INTRAMUSCULAR | Status: AC
  Filled 2012-07-08: qty 2

## 2012-07-08 MED ORDER — SODIUM CHLORIDE 0.9 % IJ SOLN
INTRAMUSCULAR | Status: DC | PRN
  Administered 2012-07-08: 07:00:00

## 2012-07-08 MED ORDER — LABETALOL HCL 5 MG/ML IV SOLN
INTRAVENOUS | Status: DC | PRN
  Administered 2012-07-08: 5 mg via INTRAVENOUS

## 2012-07-08 MED ORDER — HYDROMORPHONE HCL PF 1 MG/ML IJ SOLN
INTRAMUSCULAR | Status: AC
  Filled 2012-07-08: qty 2

## 2012-07-08 MED ORDER — LACTATED RINGERS IV SOLN
INTRAVENOUS | Status: DC | PRN
  Administered 2012-07-08 (×2): via INTRAVENOUS

## 2012-07-08 MED ORDER — POLYMYXIN B SULFATE 500000 UNITS IJ SOLR
INTRAMUSCULAR | Status: AC
  Filled 2012-07-08: qty 1

## 2012-07-08 MED ORDER — GLYCOPYRROLATE 0.2 MG/ML IJ SOLN
INTRAMUSCULAR | Status: DC | PRN
  Administered 2012-07-08: 0.4 mg via INTRAVENOUS

## 2012-07-08 MED ORDER — BSS IO SOLN
INTRAOCULAR | Status: DC | PRN
  Administered 2012-07-08: 15 mL via INTRAOCULAR

## 2012-07-08 MED ORDER — LIDOCAINE HCL 2 % IJ SOLN
INTRAMUSCULAR | Status: AC
  Filled 2012-07-08: qty 20

## 2012-07-08 MED ORDER — NEOSTIGMINE METHYLSULFATE 1 MG/ML IJ SOLN
INTRAMUSCULAR | Status: DC | PRN
  Administered 2012-07-08: 2 mg via INTRAVENOUS

## 2012-07-08 MED ORDER — PHENYLEPHRINE HCL 2.5 % OP SOLN
1.0000 [drp] | OPHTHALMIC | Status: AC | PRN
  Administered 2012-07-08 (×3): 1 [drp] via OPHTHALMIC
  Filled 2012-07-08: qty 3

## 2012-07-08 MED ORDER — KETOROLAC TROMETHAMINE 30 MG/ML IJ SOLN
30.0000 mg | Freq: Four times a day (QID) | INTRAMUSCULAR | Status: DC | PRN
  Administered 2012-07-08: 30 mg via INTRAVENOUS

## 2012-07-08 MED ORDER — CEFAZOLIN SODIUM-DEXTROSE 2-3 GM-% IV SOLR
INTRAVENOUS | Status: AC
  Filled 2012-07-08: qty 50

## 2012-07-08 MED ORDER — EPINEPHRINE HCL 1 MG/ML IJ SOLN
INTRAMUSCULAR | Status: AC
  Filled 2012-07-08: qty 1

## 2012-07-08 MED ORDER — HYPROMELLOSE (GONIOSCOPIC) 2.5 % OP SOLN
OPHTHALMIC | Status: AC
  Filled 2012-07-08: qty 15

## 2012-07-08 MED ORDER — LIDOCAINE HCL (CARDIAC) 20 MG/ML IV SOLN
INTRAVENOUS | Status: DC | PRN
  Administered 2012-07-08: 80 mg via INTRAVENOUS

## 2012-07-08 MED ORDER — CEFAZOLIN SODIUM-DEXTROSE 2-3 GM-% IV SOLR
2.0000 g | Freq: Once | INTRAVENOUS | Status: AC
  Administered 2012-07-08: 2 g via INTRAVENOUS

## 2012-07-08 MED ORDER — ONDANSETRON HCL 4 MG/2ML IJ SOLN: 4.0000 mg | Freq: Once | INTRAMUSCULAR | Status: DC | PRN

## 2012-07-08 MED ORDER — MIDAZOLAM HCL 5 MG/5ML IJ SOLN
INTRAMUSCULAR | Status: DC | PRN
  Administered 2012-07-08: 2 mg via INTRAVENOUS

## 2012-07-08 MED ORDER — CYCLOPENTOLATE HCL 1 % OP SOLN
1.0000 [drp] | OPHTHALMIC | Status: AC | PRN
  Administered 2012-07-08 (×3): 1 [drp] via OPHTHALMIC
  Filled 2012-07-08: qty 2

## 2012-07-08 MED ORDER — VECURONIUM BROMIDE 10 MG IV SOLR
INTRAVENOUS | Status: DC | PRN
  Administered 2012-07-08 (×2): 2 mg via INTRAVENOUS

## 2012-07-08 MED ORDER — DEXAMETHASONE SODIUM PHOSPHATE 10 MG/ML IJ SOLN
INTRAMUSCULAR | Status: AC
  Filled 2012-07-08: qty 1

## 2012-07-08 MED ORDER — 0.9 % SODIUM CHLORIDE (POUR BTL) OPTIME
TOPICAL | Status: DC | PRN
  Administered 2012-07-08: 1000 mL

## 2012-07-08 MED ORDER — CEFAZOLIN SODIUM-DEXTROSE 2-3 GM-% IV SOLR: INTRAVENOUS | Status: DC | PRN

## 2012-07-08 MED ORDER — DEXAMETHASONE SODIUM PHOSPHATE 10 MG/ML IJ SOLN
INTRAMUSCULAR | Status: DC | PRN
  Administered 2012-07-08: 10 mg

## 2012-07-08 MED ORDER — TETRACAINE HCL 0.5 % OP SOLN
OPHTHALMIC | Status: AC
  Filled 2012-07-08: qty 2

## 2012-07-08 MED ORDER — GATIFLOXACIN 0.5 % OP SOLN
1.0000 [drp] | OPHTHALMIC | Status: AC | PRN
  Administered 2012-07-08 (×3): 1 [drp] via OPHTHALMIC
  Filled 2012-07-08: qty 2.5

## 2012-07-08 MED ORDER — ROCURONIUM BROMIDE 100 MG/10ML IV SOLN
INTRAVENOUS | Status: DC | PRN
  Administered 2012-07-08: 10 mg via INTRAVENOUS
  Administered 2012-07-08: 40 mg via INTRAVENOUS

## 2012-07-08 MED ORDER — KETOROLAC TROMETHAMINE 30 MG/ML IJ SOLN
INTRAMUSCULAR | Status: AC
  Filled 2012-07-08: qty 1

## 2012-07-08 MED ORDER — PROPOFOL 10 MG/ML IV BOLUS
INTRAVENOUS | Status: DC | PRN
  Administered 2012-07-08: 160 mg via INTRAVENOUS

## 2012-07-08 MED ORDER — ONDANSETRON HCL 4 MG/2ML IJ SOLN
INTRAMUSCULAR | Status: DC | PRN
  Administered 2012-07-08: 4 mg via INTRAVENOUS

## 2012-07-08 MED ORDER — BSS IO SOLN
INTRAOCULAR | Status: AC
  Filled 2012-07-08: qty 15

## 2012-07-08 MED ORDER — HYDROMORPHONE HCL PF 1 MG/ML IJ SOLN
0.2500 mg | INTRAMUSCULAR | Status: DC | PRN
  Administered 2012-07-08 (×3): 0.5 mg via INTRAVENOUS

## 2012-07-08 MED ORDER — EPHEDRINE SULFATE 50 MG/ML IJ SOLN
INTRAMUSCULAR | Status: DC | PRN
  Administered 2012-07-08: 10 mg via INTRAVENOUS
  Administered 2012-07-08: 5 mg via INTRAVENOUS
  Administered 2012-07-08: 10 mg via INTRAVENOUS

## 2012-07-08 SURGICAL SUPPLY — 62 items
APL SRG 3 HI ABS STRL LF PLS (MISCELLANEOUS) ×4
APPLICATOR COTTON TIP 6IN STRL (MISCELLANEOUS) ×2 IMPLANT
APPLICATOR DR MATTHEWS STRL (MISCELLANEOUS) ×8 IMPLANT
CANNULA ANT CHAM MAIN (OPHTHALMIC RELATED) IMPLANT
CORDS BIPOLAR (ELECTRODE) IMPLANT
COVER SURGICAL LIGHT HANDLE (MISCELLANEOUS) ×2 IMPLANT
DRAPE OPHTHALMIC 77X100 STRL (CUSTOM PROCEDURE TRAY) ×2 IMPLANT
FILTER BLUE MILLIPORE (MISCELLANEOUS) IMPLANT
FILTER STRAW FLUID ASPIR (MISCELLANEOUS) IMPLANT
GAS OPHTHALMIC (MISCELLANEOUS) IMPLANT
GLOVE ECLIPSE 6.5 STRL STRAW (GLOVE) ×2 IMPLANT
GLOVE SS BIOGEL STRL SZ 7 (GLOVE) IMPLANT
GLOVE SS BIOGEL STRL SZ 8.5 (GLOVE) ×2 IMPLANT
GLOVE SUPERSENSE BIOGEL SZ 7 (GLOVE) ×1
GLOVE SUPERSENSE BIOGEL SZ 8.5 (GLOVE) ×2
GOWN EXTRA PROTECTION XL (GOWNS) ×4 IMPLANT
GOWN PREVENTION PLUS XXLARGE (GOWN DISPOSABLE) ×2 IMPLANT
GOWN STRL NON-REIN LRG LVL3 (GOWN DISPOSABLE) ×4 IMPLANT
IMPLANT SILICONE 287 (Ophthalmic Related) ×2 IMPLANT
KIT BASIN OR (CUSTOM PROCEDURE TRAY) ×1 IMPLANT
KIT PERFLUORON PROCEDURE 5ML (MISCELLANEOUS) IMPLANT
KIT ROOM TURNOVER OR (KITS) ×2 IMPLANT
KNIFE CRESCENT 2.5 55 ANG (BLADE) IMPLANT
KNIFE GRIESHABER SHARP 2.5MM (MISCELLANEOUS) ×2 IMPLANT
MARKER SKIN DUAL TIP RULER LAB (MISCELLANEOUS) ×2 IMPLANT
MASK EYE SHIELD (GAUZE/BANDAGES/DRESSINGS) ×2 IMPLANT
NDL 18GX1X1/2 (RX/OR ONLY) (NEEDLE) ×1 IMPLANT
NDL 25GX 5/8IN NON SAFETY (NEEDLE) IMPLANT
NDL HYPO 25GX1X1/2 BEV (NEEDLE) IMPLANT
NDL HYPO 30X.5 LL (NEEDLE) IMPLANT
NEEDLE 18GX1X1/2 (RX/OR ONLY) (NEEDLE) ×2 IMPLANT
NEEDLE 25GX 5/8IN NON SAFETY (NEEDLE) IMPLANT
NEEDLE HYPO 25GX1X1/2 BEV (NEEDLE) ×2 IMPLANT
NEEDLE HYPO 30X.5 LL (NEEDLE) IMPLANT
NS IRRIG 1000ML POUR BTL (IV SOLUTION) ×2 IMPLANT
PACK VITRECTOMY CUSTOM (CUSTOM PROCEDURE TRAY) ×2 IMPLANT
PACK VITRECTOMY PIC MCHSVP (PACKS) IMPLANT
PAD ARMBOARD 7.5X6 YLW CONV (MISCELLANEOUS) ×4 IMPLANT
PAD EYE OVAL STERILE LF (GAUZE/BANDAGES/DRESSINGS) ×2 IMPLANT
PROBE COHERENT CURVED (MISCELLANEOUS) ×1 IMPLANT
REPL STRA BRUSH NDL (NEEDLE) IMPLANT
REPL STRA BRUSH NEEDLE (NEEDLE) IMPLANT
RESERVOIR BACK FLUSH (MISCELLANEOUS) IMPLANT
RETRACTOR IRIS FLEX 25G GRIESH (INSTRUMENTS) IMPLANT
ROLLS DENTAL (MISCELLANEOUS) ×2 IMPLANT
SET FLUID INJECTOR (SET/KITS/TRAYS/PACK) IMPLANT
SET VGFI TUBING 8065808002 (SET/KITS/TRAYS/PACK) IMPLANT
STOCKINETTE IMPERVIOUS 9X36 MD (GAUZE/BANDAGES/DRESSINGS) ×4 IMPLANT
STOPCOCK 4 WAY LG BORE MALE ST (IV SETS) IMPLANT
SUT MERSILENE 5 0 RD 1 DA (SUTURE) ×5 IMPLANT
SUT SILK 2 0 (SUTURE) ×2
SUT SILK 2-0 18XBRD TIE 12 (SUTURE) ×1 IMPLANT
SUT VICRYL 7 0 TG140 8 (SUTURE) ×4 IMPLANT
SYR 20ML ECCENTRIC (SYRINGE) ×2 IMPLANT
SYR 30ML SLIP (SYRINGE) IMPLANT
SYR 5ML LL (SYRINGE) IMPLANT
SYR TB 1ML LUER SLIP (SYRINGE) IMPLANT
TAPE SURG TRANSPORE 1 IN (GAUZE/BANDAGES/DRESSINGS) IMPLANT
TAPE SURGICAL TRANSPORE 1 IN (GAUZE/BANDAGES/DRESSINGS) ×1
TOWEL OR 17X24 6PK STRL BLUE (TOWEL DISPOSABLE) ×3 IMPLANT
WATER STERILE IRR 1000ML POUR (IV SOLUTION) ×2 IMPLANT
WIPE INSTRUMENT VISIWIPE 73X73 (MISCELLANEOUS) ×2 IMPLANT

## 2012-07-08 NOTE — Anesthesia Preprocedure Evaluation (Addendum)
Anesthesia Evaluation  Patient identified by MRN, date of birth, ID band Patient awake    Reviewed: Allergy & Precautions, H&P , NPO status , Patient's Chart, lab work & pertinent test results, reviewed documented beta blocker date and time   Airway Mallampati: I TM Distance: >3 FB Neck ROM: full    Dental  (+) Dental Advisory Given   Pulmonary          Cardiovascular hypertension, Pt. on home beta blockers Rhythm:regular Rate:Normal     Neuro/Psych    GI/Hepatic   Endo/Other    Renal/GU      Musculoskeletal   Abdominal   Peds  Hematology   Anesthesia Other Findings   Reproductive/Obstetrics                          Anesthesia Physical Anesthesia Plan  ASA: II  Anesthesia Plan: General   Post-op Pain Management:    Induction: Intravenous  Airway Management Planned: Oral ETT  Additional Equipment:   Intra-op Plan:   Post-operative Plan: Extubation in OR  Informed Consent: I have reviewed the patients History and Physical, chart, labs and discussed the procedure including the risks, benefits and alternatives for the proposed anesthesia with the patient or authorized representative who has indicated his/her understanding and acceptance.     Plan Discussed with: CRNA, Anesthesiologist and Surgeon  Anesthesia Plan Comments:         Anesthesia Quick Evaluation

## 2012-07-08 NOTE — Transfer of Care (Signed)
Immediate Anesthesia Transfer of Care Note  Patient: Kylie Harris  Procedure(s) Performed: Procedure(s) (LRB) with comments: SCLERAL BUCKLE WITH CRYO (Right) - CRYOPEXY  Patient Location: PACU  Anesthesia Type:General  Level of Consciousness: sedated  Airway & Oxygen Therapy: Patient Spontanous Breathing and Patient connected to nasal cannula oxygen  Post-op Assessment: Report given to PACU RN and Post -op Vital signs reviewed and stable  Post vital signs: Reviewed and stable  Complications: No apparent anesthesia complications

## 2012-07-08 NOTE — Anesthesia Postprocedure Evaluation (Signed)
  Anesthesia Post-op Note  Patient: Kylie Harris  Procedure(s) Performed: Procedure(s) (LRB) with comments: SCLERAL BUCKLE WITH CRYO (Right) - CRYOPEXY  Patient Location: PACU  Anesthesia Type:General  Level of Consciousness: awake, sedated and patient cooperative  Airway and Oxygen Therapy: Patient Spontanous Breathing  Post-op Pain: mild  Post-op Assessment: Post-op Vital signs reviewed, Patient's Cardiovascular Status Stable, Respiratory Function Stable, Patent Airway, No signs of Nausea or vomiting and Pain level controlled  Post-op Vital Signs: stable  Complications: No apparent anesthesia complications

## 2012-07-08 NOTE — Anesthesia Procedure Notes (Signed)
Procedure Name: Intubation Date/Time: 07/08/2012 7:53 AM Performed by: Alanda Amass A Pre-anesthesia Checklist: Patient identified, Timeout performed, Emergency Drugs available, Suction available and Patient being monitored Patient Re-evaluated:Patient Re-evaluated prior to inductionOxygen Delivery Method: Circle system utilized Preoxygenation: Pre-oxygenation with 100% oxygen Intubation Type: IV induction Ventilation: Mask ventilation without difficulty Laryngoscope Size: Mac and 3 Grade View: Grade III Tube type: Oral Tube size: 7.5 mm Number of attempts: 2 Airway Equipment and Method: Stylet Placement Confirmation: ETT inserted through vocal cords under direct vision and breath sounds checked- equal and bilateral Secured at: 21 cm Tube secured with: Tape Dental Injury: Teeth and Oropharynx as per pre-operative assessment

## 2012-07-08 NOTE — Brief Op Note (Signed)
07/08/2012  10:05 AM  PATIENT:  Kylie Harris  56 y.o. female  PRE-OPERATIVE DIAGNOSIS:  RETINAL DETACHMENT right eye  POST-OPERATIVE DIAGNOSIS:  RETINAL DETACHMENT eye, total  PROCEDURE:  Procedure(s) (LRB) with comments: SCLERAL BUCKLE WITH CRYO (Right) - CRYOPEXY, and external drainage of subretinal fluid  SURGEON:  Surgeon(s) and Role:    * Edmon Crape, MD - Primary  PHYSICIAN ASSISTANT:   ASSISTANTS: none   ANESTHESIA:   general  EBL:  Total I/O In: 1000 [I.V.:1000] Out: -   BLOOD ADMINISTERED:none  DRAINS: none   LOCAL MEDICATIONS USED:  NONE  SPECIMEN:  No Specimen  DISPOSITION OF SPECIMEN:  N/A  COUNTS:  YES  TOURNIQUET:  * No tourniquets in log *  DICTATION: .Other Dictation: Dictation Number [id number]  PLAN OF CARE: Discharge to home after PACU  PATIENT DISPOSITION:  PACU - hemodynamically stable.   Delay start of Pharmacological VTE agent (>24hrs) due to surgical blood loss or risk of bleeding: yes

## 2012-07-09 NOTE — Op Note (Signed)
NAMECHEMEKA, FILICE              ACCOUNT NO.:  [id number]  MEDICAL RECORD NO.:  [id number]  LOCATION:  [facility]                         FACILITY:  [facility]  PHYSICIAN:  Jillyn Hidden A. Juanita Devincent, M.D.   DATE OF BIRTH:  1956-04-15  DATE OF PROCEDURE:  07/08/2012 DATE OF DISCHARGE:  07/08/2012                              OPERATIVE REPORT   PREOPERATIVE DIAGNOSIS:  Total rhegmatogenous detachment, right eye.  POSTOPERATIVE DIAGNOSIS:  Total rhegmatogenous detachment, right eye.  PROCEDURES: 1. Scleral buckle with retinal cryopexy. 2. External drainage of subretinal fluid.  SURGEON:  Alford Highland. Everet Flagg, M.D.  ANESTHESIA:  General anesthesia.  INDICATION FOR PROCEDURE:  The patient is a 56 year old woman who has spontaneous rhegmatogenous detachment, who has had delay in surgery because she had several weeks of symptoms followed thereafter by covering clinical setting of narrowing of glaucoma, which required surgical intervention with YAG periphery iridectomies to allow for surgical intervention to proceed and dilation.  The patient understands this is an attempt to surgically reattached and to allow for healing process to commence for retinal detachment.  She understands the risks of anesthesia including the occurrence of death, loss of the eye including, but limited to from the underlying condition as well as surgical repair, hemorrhage, infection, scarring, need for another surgery, no change in vision, loss of vision, progression of disease despite intervention.  DESCRIPTION OF PROCEDURE:  After appropriate signed consent was obtained, the patient was taken to the operating room.  In the operating room, appropriate monitors were followed by mild sedation.  Appropriate site selection was confirmed with the operating staff.  General anesthesia was induced without difficulty.  Right periocular region was prepped and draped in usual ophthalmic fashion.  A lid speculum was applied.  The  conjunctival peritomy was then fashioned in the supranasal and inferonasal quadrants.  Rectus muscle was isolated on 2-0 silk ties. 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  As there was adherent tenon's in all aspects of the sclera on 360 degrees, which was extending from nearly the limbus and then posteriorly.  In other words, it was difficult to find the bare sclera.  This was a densely adherent and I felt that it was not necessary to dissect and I was concerned about complications to the sclera.  Nonetheless, 287 explant was selected after indirect ophthalmoscopy had been performed and confirmed that there was a total retinal detachment, highly bullous and that there would be necessary volume displacement to reattach the retina.  Retinal cryopexy was applied to two rows on the vitreous base 360 degrees.  The initial inferior retinal detachment was highly bullous and there was no sign of retinal hole or tear through the cataract, which was present.  At this time, 287 explant was then placed under each of the rectus muscles, 5-0 Mersilene mattress to place each quadrant.  It was notable that the eye was somewhat small and so that there was somewhat difficulty in applying the buckle with appropriate spacing in the sutures in the inferior quadrant.  Nonetheless, I was able to attach the buckle and then in circumferentially secured the inferonasal quadrant and  then appropriate tension did allow for appropriate height to be obtained later on after a drain and subretinal fluid had carried out. Initial attempt at drainage of subretinal fluid, a 26-gauge needle, an ophthalmoscopy was not successful because of the poor visualization through the lens despite using a Keeler scope.  For this reason, an open sclerotomy was then fashioned using 0.01 Grieshaber blade.  Diathermy and moderately sharp needle was then used to  penetrate the RPE layer and choroid and drainage of subretinal fluid.  Poor drainage of subretinal fluid was carried out inferonasally and there was an inadequate drainage.  I then selected infranasal sclerotomy would be performed and at this point, had an adequate drainage of subretinal fluid and clear fluid without difficulty.  At this time, the buckle was then passed under the anterior rectus muscles, secured in each quadrant and firmly tied with 5-0 Mersilene mattress suture and then secured in the inferonasal quadrant with two vertical mattress sutures and 5-0 Mersilene.  Excellent scleral indentation was obtained.  Ophthalmoscopy confirmed that the buckle reattached the retina peripherally.  There was still some subretinal fluid posteriorly and it was deemed not safe to drain this.  At this time, the bed of buckle was irrigated with bug juice.  Conjunctiva and tenon's were brought forward.  The tenon's capsule presumably contracted because this conjunctiva itself had some difficulty coming back to the limbus.  For this reason, I did use 7-0 Vicryl to secure the internal tenons capsule to the anterior heads of the superolateral and inferior rectus muscles so as to prevent retraction while securing the conjunctiva and also to prevent an extensive traction and the limbal sutures were subsequently placed. Finally, we used interrupted 7-0 Vicryl suture to secure the conjunctiva.  Conjunctiva was closed without difficulty.  At this time, subconjunctival Decadron was applied.  Sterile patch and Fox shield were applied.  Intraocular pressure was assessed, found to be adequate.  The patient was taken to the PACU in good and stable condition tolerated the procedure well without complication.  Sterile patch and Fox shield were then applied.     Alford Highland Luismario Coston, M.D.     GAR/MEDQ  D:  07/08/2012  T:  07/09/2012  Job:  [id number]

## 2012-07-11 ENCOUNTER — Encounter (HOSPITAL_COMMUNITY): Payer: Self-pay | Admitting: Ophthalmology

## 2012-09-01 ENCOUNTER — Other Ambulatory Visit: Payer: Self-pay | Admitting: Ophthalmology

## 2012-09-02 ENCOUNTER — Encounter (HOSPITAL_COMMUNITY): Payer: Self-pay | Admitting: Pharmacy Technician

## 2012-09-08 ENCOUNTER — Encounter (HOSPITAL_COMMUNITY)
Admission: RE | Admit: 2012-09-08 | Discharge: 2012-09-08 | Disposition: A | Discharge status: Home / Self Care | Source: Ambulatory Visit | Attending: Ophthalmology | Admitting: Ophthalmology

## 2012-09-08 ENCOUNTER — Encounter (HOSPITAL_COMMUNITY): Payer: Self-pay

## 2012-09-08 HISTORY — DX: Serous retinal detachment, unspecified eye: H33.20

## 2012-09-08 HISTORY — DX: Unspecified glaucoma: H40.9

## 2012-09-08 HISTORY — DX: Unspecified cataract: H26.9

## 2012-09-08 LAB — BASIC METABOLIC PANEL
CO2: 28 mEq/L (ref 19–32)
Calcium: 10 mg/dL (ref 8.4–10.5)
Chloride: 98 mEq/L (ref 96–112)
Glucose, Bld: 120 mg/dL — ABNORMAL HIGH (ref 70–99)
Potassium: 4.2 mEq/L (ref 3.5–5.1)
Sodium: 138 mEq/L (ref 135–145)

## 2012-09-08 LAB — CBC
HCT: 40 % (ref 36.0–46.0)
Hemoglobin: 12.8 g/dL (ref 12.0–15.0)
MCH: 26.5 pg (ref 26.0–34.0)
MCV: 82.8 fL (ref 78.0–100.0)
Platelets: 489 10*3/uL — ABNORMAL HIGH (ref 150–400)
RBC: 4.83 MIL/uL (ref 3.87–5.11)
WBC: 7.8 10*3/uL (ref 4.0–10.5)

## 2012-09-08 LAB — SURGICAL PCR SCREEN: Staphylococcus aureus: NEGATIVE

## 2012-09-08 NOTE — Progress Notes (Signed)
Pt doesn't have a cardiologist   Denies ever having an echo/stress test/heart cath  Ekg in epic from 2013 and Cxr from 2013   Pt doesn't have a medical md

## 2012-09-08 NOTE — Pre-Procedure Instructions (Signed)
20 Kylie Harris  09/08/2012   Your procedure is scheduled on:  Thurs, Jan 9 @ 7:15 AM  Report to [facility] at 5:30 AM.  Call this number if you have problems the morning of surgery: [phone]   Remember:   Do not eat food:After Midnight.  May have clear liquids:until Midnight .    Take these medicines the morning of surgery with A SIP OF WATER: Amlodipine(Norvasc) and Metoprolol(Toprol)   Do not wear jewelry, make-up or nail polish.  Do not wear lotions, powders, or perfumes. You may wear deodorant.  Do not shave 48 hours prior to surgery.  Do not bring valuables to the hospital.  Contacts, dentures or bridgework may not be worn into surgery.  Leave suitcase in the car. After surgery it may be brought to your room.  For patients admitted to the hospital, checkout time is 11:00 AM the day of discharge.   Patients discharged the day of surgery will not be allowed to drive home.    Special Instructions: Shower using CHG 2 nights before surgery and the night before surgery.  If you shower the day of surgery use CHG.  Use special wash - you have one bottle of CHG for all showers.  You should use approximately 1/3 of the bottle for each shower.   Please read over the following fact sheets that you were given: Pain Booklet, Coughing and Deep Breathing, MRSA Information and Surgical Site Infection Prevention

## 2012-09-12 ENCOUNTER — Ambulatory Visit (HOSPITAL_COMMUNITY): Admission: RE | Admit: 2012-09-12 | Payer: Self-pay | Source: Ambulatory Visit | Admitting: Ophthalmology

## 2012-09-12 ENCOUNTER — Encounter (HOSPITAL_COMMUNITY): Admission: RE | Payer: Self-pay | Source: Ambulatory Visit

## 2012-09-12 SURGERY — PARS PLANA VITRECTOMY WITH 25 GAUGE
Anesthesia: General | Laterality: Right

## 2012-09-14 NOTE — H&P (Signed)
Kylie Harris is an 57 y.o. female.   Chief Complaint:  VISION LOSS, FROM RECURRENT RETINAL DETACHMENT, TRACTION RETINAL DETACHMENT, AFTER SCLERAL BUCKLE RIGHT EYE HPI: S/P SCLERAL BUCKLE AND CRYOPEXY November 2013, WITH PROGRESSIVE CATARACT FORMATION, AND PROLIFERATIVE VITREORETINOPATHY RIGHT EYE, NOW NEEDING REPAIR VIA VITRECTOMY, ENDOLASER, INJECTION OF SILICONE OIL RIGHT EYE.  Past Medical History  Diagnosis Date  . Hypertension     takes Metoprolol,Micardis,Amlodipine,and CLonidine daily  . Cataract     left eye  . Glaucoma   . Detached retina     right    Past Surgical History  Procedure Date  . Partial hysterectomy   . Scleral buckle with cryo 07/08/2012    Procedure: SCLERAL BUCKLE WITH CRYO;  Surgeon: Edmon Crape, MD;  Location: [facility] OR;  Service: Ophthalmology;  Laterality: Right;  CRYOPEXY  . Eye surgery     cataract removed from right    Family History  Problem Relation Age of Onset  . Hypertension Mother   . Heart failure Mother    Social History:  reports that she has never smoked. She does not have any smokeless tobacco history on file. She reports that she drinks about 1.2 ounces of alcohol per week. She reports that she does not use illicit drugs.  Allergies: No Known Allergies  No prescriptions prior to admission    No results found for this or any previous visit (from the past 48 hour(s)). No results found.  Review of Systems  Constitutional: Negative.   HENT: Negative.   Eyes: Positive for blurred vision.  Gastrointestinal: Negative.   Skin: Negative.   Endo/Heme/Allergies: Negative.   Psychiatric/Behavioral: Negative.     There were no vitals taken for this visit. Physical Exam  Vitals reviewed. Constitutional: She appears well-developed and well-nourished.  HENT:  Head: Normocephalic and atraumatic.  Eyes: Conjunctivae normal are normal.    Neck: Normal range of motion. Neck supple.  Cardiovascular: Normal rate.   Respiratory: Effort  normal.  GI: Soft.  Neurological: She is alert.  Psychiatric: She has a normal mood and affect. Her speech is normal and behavior is normal. Judgment and thought content normal. Cognition and memory are normal.     Assessment/Plan HISTORY OF RETINAL DETACHMENT RIGHT EYE. 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  NEEDS VITRECTOMY ENDOLASER, MEMBRANE PEEL, INJECTION OF VITREOUS SUBSTITUTE, SILICONE OIL RIGHT EYE.   Campbell Kray A 09/14/2012, 10:01 PM

## 2012-09-15 ENCOUNTER — Encounter (HOSPITAL_COMMUNITY): Payer: Self-pay | Admitting: Anesthesiology

## 2012-09-15 ENCOUNTER — Ambulatory Visit (HOSPITAL_COMMUNITY)
Admission: RE | Admit: 2012-09-15 | Discharge: 2012-09-15 | Disposition: A | Discharge status: Home / Self Care | Source: Ambulatory Visit | Attending: Ophthalmology | Admitting: Ophthalmology

## 2012-09-15 ENCOUNTER — Ambulatory Visit (HOSPITAL_COMMUNITY): Admitting: Anesthesiology

## 2012-09-15 ENCOUNTER — Encounter (HOSPITAL_COMMUNITY): Payer: Self-pay | Admitting: *Deleted

## 2012-09-15 ENCOUNTER — Encounter (HOSPITAL_COMMUNITY)
Admission: RE | Disposition: A | Discharge status: Home / Self Care | Payer: Self-pay | Source: Ambulatory Visit | Attending: Ophthalmology

## 2012-09-15 DIAGNOSIS — Z8249 Family history of ischemic heart disease and other diseases of the circulatory system: Secondary | ICD-10-CM | POA: Insufficient documentation

## 2012-09-15 DIAGNOSIS — H409 Unspecified glaucoma: Secondary | ICD-10-CM | POA: Insufficient documentation

## 2012-09-15 DIAGNOSIS — Z90710 Acquired absence of both cervix and uterus: Secondary | ICD-10-CM | POA: Insufficient documentation

## 2012-09-15 DIAGNOSIS — H3520 Other non-diabetic proliferative retinopathy, unspecified eye: Secondary | ICD-10-CM | POA: Insufficient documentation

## 2012-09-15 DIAGNOSIS — H3340 Traction detachment of retina, unspecified eye: Secondary | ICD-10-CM

## 2012-09-15 DIAGNOSIS — Z9849 Cataract extraction status, unspecified eye: Secondary | ICD-10-CM | POA: Insufficient documentation

## 2012-09-15 DIAGNOSIS — I10 Essential (primary) hypertension: Secondary | ICD-10-CM | POA: Insufficient documentation

## 2012-09-15 HISTORY — PX: PHOTOCOAGULATION WITH LASER: SHX6027

## 2012-09-15 HISTORY — PX: MEMBRANE PEEL: SHX5967

## 2012-09-15 HISTORY — PX: PARS PLANA VITRECTOMY: SHX2166

## 2012-09-15 HISTORY — PX: PERFLUORONE INJECTION: SHX5302

## 2012-09-15 SURGERY — PARS PLANA VITRECTOMY WITH 25 GAUGE
Anesthesia: General | Site: Eye | Laterality: Right | Wound class: Clean

## 2012-09-15 MED ORDER — GLYCOPYRROLATE 0.2 MG/ML IJ SOLN
INTRAMUSCULAR | Status: DC | PRN
  Administered 2012-09-15: 0.6 mg via INTRAVENOUS

## 2012-09-15 MED ORDER — ONDANSETRON HCL 4 MG/2ML IJ SOLN
INTRAMUSCULAR | Status: DC | PRN
  Administered 2012-09-15: 4 mg via INTRAVENOUS

## 2012-09-15 MED ORDER — CYCLOPENTOLATE HCL 1 % OP SOLN
1.0000 [drp] | OPHTHALMIC | Status: AC
  Administered 2012-09-15 (×3): 1 [drp] via OPHTHALMIC
  Filled 2012-09-15: qty 2

## 2012-09-15 MED ORDER — HYPROMELLOSE (GONIOSCOPIC) 2.5 % OP SOLN
OPHTHALMIC | Status: DC | PRN
  Administered 2012-09-15: 2 [drp] via OPHTHALMIC

## 2012-09-15 MED ORDER — OXYCODONE HCL 5 MG PO TABS
5.0000 mg | ORAL_TABLET | Freq: Once | ORAL | Status: AC | PRN
  Administered 2012-09-15: 5 mg via ORAL

## 2012-09-15 MED ORDER — HYPROMELLOSE (GONIOSCOPIC) 2.5 % OP SOLN
OPHTHALMIC | Status: AC
  Filled 2012-09-15: qty 15

## 2012-09-15 MED ORDER — PROMETHAZINE HCL 25 MG/ML IJ SOLN: 6.2500 mg | INTRAMUSCULAR | Status: DC | PRN

## 2012-09-15 MED ORDER — FENTANYL CITRATE 0.05 MG/ML IJ SOLN
INTRAMUSCULAR | Status: DC | PRN
  Administered 2012-09-15 (×5): 50 ug via INTRAVENOUS

## 2012-09-15 MED ORDER — DEXAMETHASONE SODIUM PHOSPHATE 10 MG/ML IJ SOLN
INTRAMUSCULAR | Status: AC
  Filled 2012-09-15: qty 1

## 2012-09-15 MED ORDER — DEXAMETHASONE SODIUM PHOSPHATE 10 MG/ML IJ SOLN
INTRAMUSCULAR | Status: DC | PRN
  Administered 2012-09-15: 10 mg

## 2012-09-15 MED ORDER — ROCURONIUM BROMIDE 100 MG/10ML IV SOLN
INTRAVENOUS | Status: DC | PRN
  Administered 2012-09-15: 40 mg via INTRAVENOUS

## 2012-09-15 MED ORDER — MEPERIDINE HCL 25 MG/ML IJ SOLN: 6.2500 mg | INTRAMUSCULAR | Status: DC | PRN

## 2012-09-15 MED ORDER — NEOSTIGMINE METHYLSULFATE 1 MG/ML IJ SOLN
INTRAMUSCULAR | Status: DC | PRN
  Administered 2012-09-15: 4 mg via INTRAVENOUS

## 2012-09-15 MED ORDER — INDOCYANINE GREEN 25 MG IV SOLR
5.0000 mg | Freq: Once | INTRAVENOUS | Status: DC
  Filled 2012-09-15: qty 25

## 2012-09-15 MED ORDER — POLYMYXIN B SULFATE 500000 UNITS IJ SOLR
INTRAMUSCULAR | Status: AC
  Filled 2012-09-15: qty 1

## 2012-09-15 MED ORDER — BSS IO SOLN
INTRAOCULAR | Status: DC | PRN
  Administered 2012-09-15: 15 mL via INTRAOCULAR

## 2012-09-15 MED ORDER — KETOROLAC TROMETHAMINE 30 MG/ML IJ SOLN
INTRAMUSCULAR | Status: AC
  Filled 2012-09-15: qty 1

## 2012-09-15 MED ORDER — EPHEDRINE SULFATE 50 MG/ML IJ SOLN
INTRAMUSCULAR | Status: DC | PRN
  Administered 2012-09-15 (×2): 5 mg via INTRAVENOUS

## 2012-09-15 MED ORDER — HYDROMORPHONE HCL PF 1 MG/ML IJ SOLN
0.2500 mg | INTRAMUSCULAR | Status: DC | PRN
  Administered 2012-09-15 (×2): 0.5 mg via INTRAVENOUS

## 2012-09-15 MED ORDER — CEFAZOLIN SODIUM 1-5 GM-% IV SOLN
INTRAVENOUS | Status: AC
  Filled 2012-09-15: qty 50

## 2012-09-15 MED ORDER — NA CHONDROIT SULF-NA HYALURON 40-30 MG/ML IO SOLN
INTRAOCULAR | Status: AC
  Filled 2012-09-15: qty 0.5

## 2012-09-15 MED ORDER — HYDROMORPHONE HCL PF 1 MG/ML IJ SOLN
INTRAMUSCULAR | Status: AC
  Filled 2012-09-15: qty 1

## 2012-09-15 MED ORDER — EPINEPHRINE HCL 1 MG/ML IJ SOLN
INTRAOCULAR | Status: DC | PRN
  Administered 2012-09-15: 07:00:00

## 2012-09-15 MED ORDER — BSS PLUS IO SOLN
INTRAOCULAR | Status: AC
  Filled 2012-09-15: qty 500

## 2012-09-15 MED ORDER — ARTIFICIAL TEARS OP OINT
TOPICAL_OINTMENT | OPHTHALMIC | Status: DC | PRN
  Administered 2012-09-15: 1 via OPHTHALMIC

## 2012-09-15 MED ORDER — PROPOFOL 10 MG/ML IV BOLUS
INTRAVENOUS | Status: DC | PRN
  Administered 2012-09-15: 140 mg via INTRAVENOUS

## 2012-09-15 MED ORDER — SODIUM CHLORIDE 0.9 % IV SOLN
INTRAVENOUS | Status: DC | PRN
  Administered 2012-09-15 (×2): via INTRAVENOUS

## 2012-09-15 MED ORDER — EPINEPHRINE HCL 1 MG/ML IJ SOLN
INTRAMUSCULAR | Status: AC
  Filled 2012-09-15: qty 1

## 2012-09-15 MED ORDER — CEFAZOLIN SODIUM 1-5 GM-% IV SOLN
1.0000 g | Freq: Once | INTRAVENOUS | Status: AC
  Administered 2012-09-15: 1 g via INTRAVENOUS
  Filled 2012-09-15: qty 50

## 2012-09-15 MED ORDER — LIDOCAINE HCL (CARDIAC) 20 MG/ML IV SOLN
INTRAVENOUS | Status: DC | PRN
  Administered 2012-09-15: 40 mg via INTRAVENOUS
  Administered 2012-09-15: 60 mg via INTRAVENOUS

## 2012-09-15 MED ORDER — DEXTROSE 5 % IV BOLUS: 50.0000 mL | Freq: Once | INTRAVENOUS | Status: DC

## 2012-09-15 MED ORDER — LIDOCAINE HCL 2 % IJ SOLN
INTRAMUSCULAR | Status: AC
  Filled 2012-09-15: qty 20

## 2012-09-15 MED ORDER — GENTAMICIN SULFATE 40 MG/ML IJ SOLN
INTRAMUSCULAR | Status: AC
  Filled 2012-09-15: qty 2

## 2012-09-15 MED ORDER — OXYCODONE HCL 5 MG PO TABS
ORAL_TABLET | ORAL | Status: AC
  Filled 2012-09-15: qty 1

## 2012-09-15 MED ORDER — TETRACAINE HCL 0.5 % OP SOLN
OPHTHALMIC | Status: AC
  Filled 2012-09-15: qty 2

## 2012-09-15 MED ORDER — GATIFLOXACIN 0.5 % OP SOLN
1.0000 [drp] | OPHTHALMIC | Status: AC | PRN
  Administered 2012-09-15 (×3): 1 [drp] via OPHTHALMIC
  Filled 2012-09-15: qty 2.5

## 2012-09-15 MED ORDER — OXYCODONE HCL 5 MG/5ML PO SOLN: 5.0000 mg | Freq: Once | ORAL | Status: AC | PRN

## 2012-09-15 MED ORDER — SODIUM HYALURONATE 10 MG/ML IO SOLN
INTRAOCULAR | Status: AC
  Filled 2012-09-15: qty 0.85

## 2012-09-15 MED ORDER — PHENYLEPHRINE HCL 2.5 % OP SOLN
1.0000 [drp] | OPHTHALMIC | Status: AC | PRN
  Administered 2012-09-15 (×3): 1 [drp] via OPHTHALMIC
  Filled 2012-09-15: qty 3

## 2012-09-15 SURGICAL SUPPLY — 69 items
ACCESSORY FRAGMATOME (MISCELLANEOUS) IMPLANT
APL SRG 3 HI ABS STRL LF PLS (MISCELLANEOUS) ×1
APPLICATOR COTTON TIP 6IN STRL (MISCELLANEOUS) ×2 IMPLANT
APPLICATOR DR MATTHEWS STRL (MISCELLANEOUS) ×1 IMPLANT
BLADE MVR KNIFE 20G (BLADE) ×1 IMPLANT
CANNULA ANT CHAM MAIN (OPHTHALMIC RELATED) IMPLANT
CANNULA FLEX TIP 25G (CANNULA) ×1 IMPLANT
CLOTH BEACON ORANGE TIMEOUT ST (SAFETY) ×2 IMPLANT
CORDS BIPOLAR (ELECTRODE) IMPLANT
DRAPE INCISE 51X51 W/FILM STRL (DRAPES) ×1 IMPLANT
DRAPE OPHTHALMIC 77X100 STRL (CUSTOM PROCEDURE TRAY) ×2 IMPLANT
FILTER BLUE MILLIPORE (MISCELLANEOUS) IMPLANT
FORCEPS ECKARDT ILM 25G SERR (OPHTHALMIC RELATED) ×1 IMPLANT
FORCEPS HORIZONTAL 25G DISP (OPHTHALMIC RELATED) IMPLANT
GAS OPHTHALMIC (MISCELLANEOUS) IMPLANT
GLOVE ECLIPSE 6.5 STRL STRAW (GLOVE) ×1 IMPLANT
GLOVE SS BIOGEL STRL SZ 8.5 (GLOVE) ×1 IMPLANT
GLOVE SUPERSENSE BIOGEL SZ 8.5 (GLOVE) ×1
GLOVE SURG SS PI 6.5 STRL IVOR (GLOVE) ×2 IMPLANT
GOWN PREVENTION PLUS LG XLONG (DISPOSABLE) ×1 IMPLANT
GOWN STRL NON-REIN LRG LVL3 (GOWN DISPOSABLE) ×3 IMPLANT
GOWN STRL REIN XL XLG (GOWN DISPOSABLE) ×1 IMPLANT
ILLUMINATOR CHOW PICK 25GA (MISCELLANEOUS) IMPLANT
ILLUMINATOR ENDO 25GA (MISCELLANEOUS) ×2 IMPLANT
KIT BASIN OR (CUSTOM PROCEDURE TRAY) ×2 IMPLANT
KIT PERFLUORON PROCEDURE 5ML (MISCELLANEOUS) ×1 IMPLANT
KIT ROOM TURNOVER OR (KITS) ×2 IMPLANT
KNIFE CRESCENT 2.5 55 ANG (BLADE) IMPLANT
LENS BIOM SUPER VIEW SET DISP (OPHTHALMIC RELATED) ×2 IMPLANT
MARKER SKIN DUAL TIP RULER LAB (MISCELLANEOUS) IMPLANT
MASK EYE SHIELD (GAUZE/BANDAGES/DRESSINGS) ×1 IMPLANT
MICROPICK 25G (MISCELLANEOUS)
NDL 18GX1X1/2 (RX/OR ONLY) (NEEDLE) IMPLANT
NDL 25GX 5/8IN NON SAFETY (NEEDLE) IMPLANT
NDL FILTER BLUNT 18X1 1/2 (NEEDLE) IMPLANT
NDL HYPO 25GX1X1/2 BEV (NEEDLE) IMPLANT
NEEDLE 18GX1X1/2 (RX/OR ONLY) (NEEDLE) IMPLANT
NEEDLE 25GX 5/8IN NON SAFETY (NEEDLE) ×2 IMPLANT
NEEDLE 27GAX1X1/2 (NEEDLE) ×1 IMPLANT
NEEDLE FILTER BLUNT 18X 1/2SAF (NEEDLE) ×1
NEEDLE FILTER BLUNT 18X1 1/2 (NEEDLE) ×1 IMPLANT
NEEDLE HYPO 25GX1X1/2 BEV (NEEDLE) IMPLANT
NS IRRIG 1000ML POUR BTL (IV SOLUTION) ×2 IMPLANT
OIL SILICONE OPHTHALMIC ADAPTO (Ophthalmic Related) ×1 IMPLANT
PACK VITRECTOMY CUSTOM (CUSTOM PROCEDURE TRAY) ×2 IMPLANT
PAD ARMBOARD 7.5X6 YLW CONV (MISCELLANEOUS) ×4 IMPLANT
PAD EYE OVAL STERILE LF (GAUZE/BANDAGES/DRESSINGS) ×1 IMPLANT
PAK VITRECTOMY PIK 25 GA (OPHTHALMIC RELATED) ×2 IMPLANT
PENCIL BIPOLAR 25GA STR DISP (OPHTHALMIC RELATED) IMPLANT
PICK MICROPICK 25G (MISCELLANEOUS) IMPLANT
PROBE DIRECTIONAL LASER (MISCELLANEOUS) ×1 IMPLANT
ROLLS DENTAL (MISCELLANEOUS) ×2 IMPLANT
SCRAPER DIAMOND 25GA (OPHTHALMIC RELATED) IMPLANT
SET FLUID INJECTOR (SET/KITS/TRAYS/PACK) ×1 IMPLANT
STOCKINETTE IMPERVIOUS 9X36 MD (GAUZE/BANDAGES/DRESSINGS) ×4 IMPLANT
STOPCOCK 4 WAY LG BORE MALE ST (IV SETS) IMPLANT
SUT ETHILON 10 0 CS140 6 (SUTURE) IMPLANT
SUT ETHILON 8 0 BV130 4 (SUTURE) IMPLANT
SUT MERSILENE 5 0 RD 1 DA (SUTURE) IMPLANT
SUT PROLENE 10 0 CIF 4 DA (SUTURE) IMPLANT
SUT VICRYL 7 0 TG140 8 (SUTURE) IMPLANT
SYR 30ML SLIP (SYRINGE) IMPLANT
SYR 5ML LL (SYRINGE) IMPLANT
SYR TB 1ML LUER SLIP (SYRINGE) ×1 IMPLANT
TAPE SURG TRANSPORE 1 IN (GAUZE/BANDAGES/DRESSINGS) IMPLANT
TAPE SURGICAL TRANSPORE 1 IN (GAUZE/BANDAGES/DRESSINGS) ×1
TOWEL OR 17X24 6PK STRL BLUE (TOWEL DISPOSABLE) ×4 IMPLANT
WATER STERILE IRR 1000ML POUR (IV SOLUTION) ×2 IMPLANT
WIPE INSTRUMENT VISIWIPE 73X73 (MISCELLANEOUS) ×2 IMPLANT

## 2012-09-15 NOTE — Progress Notes (Addendum)
Patient arrived to post op with 8/10. Patient intermittently sleeping when not being stimulated. Patient alert and oriented. Spoke with Dr. Luciana Axe who wants patient to go to office on the way home and pick up a prescription.

## 2012-09-15 NOTE — Anesthesia Preprocedure Evaluation (Signed)
Anesthesia Evaluation  Patient identified by MRN, date of birth, ID band Patient awake    Reviewed: Allergy & Precautions, H&P , NPO status , Patient's Chart, lab work & pertinent test results  History of Anesthesia Complications Negative for: history of anesthetic complications  Airway Mallampati: I      Dental  (+) Teeth Intact   Pulmonary neg pulmonary ROS,  breath sounds clear to auscultation        Cardiovascular hypertension, Rhythm:Regular Rate:Normal     Neuro/Psych negative neurological ROS     GI/Hepatic negative GI ROS, Neg liver ROS,   Endo/Other  negative endocrine ROS  Renal/GU negative Renal ROS     Musculoskeletal negative musculoskeletal ROS (+)   Abdominal   Peds  Hematology negative hematology ROS (+)   Anesthesia Other Findings   Reproductive/Obstetrics negative OB ROS                           Anesthesia Physical Anesthesia Plan  ASA: II  Anesthesia Plan: General   Post-op Pain Management:    Induction: Intravenous  Airway Management Planned: Oral ETT  Additional Equipment:   Intra-op Plan:   Post-operative Plan: Extubation in OR  Informed Consent: I have reviewed the patients History and Physical, chart, labs and discussed the procedure including the risks, benefits and alternatives for the proposed anesthesia with the patient or authorized representative who has indicated his/her understanding and acceptance.   Dental advisory given  Plan Discussed with: CRNA and Surgeon  Anesthesia Plan Comments:         Anesthesia Quick Evaluation

## 2012-09-15 NOTE — Progress Notes (Signed)
Patient and family agreeable to plan of patient going by Dr. Ephriam Knuckles office before going home. Patient states she feels like she can tolerate going home and resting. Patient intermittently resting but alert and oriented. Discharge instructions given to family.

## 2012-09-15 NOTE — Anesthesia Procedure Notes (Addendum)
Procedure Name: Intubation Date/Time: 09/15/2012 8:12 AM Performed by: Fransisca Kaufmann Pre-anesthesia Checklist: Patient identified, Emergency Drugs available, Suction available, Patient being monitored and Timeout performed Patient Re-evaluated:Patient Re-evaluated prior to inductionOxygen Delivery Method: Circle system utilized Preoxygenation: Pre-oxygenation with 100% oxygen Intubation Type: IV induction Ventilation: Mask ventilation without difficulty Laryngoscope Size: Miller and 2 Grade View: Grade II Tube type: Oral Tube size: 7.5 mm Number of attempts: 1 Airway Equipment and Method: Stylet Placement Confirmation: ETT inserted through vocal cords under direct vision,  positive ETCO2 and breath sounds checked- equal and bilateral Secured at: 22 cm Tube secured with: Tape Dental Injury: Teeth and Oropharynx as per pre-operative assessment

## 2012-09-15 NOTE — Anesthesia Postprocedure Evaluation (Signed)
  Anesthesia Post-op Note  Patient: Kylie Harris  Procedure(s) Performed: Procedure(s) (LRB) with comments: PARS PLANA VITRECTOMY WITH 25 GAUGE (Right) - SILICONE OIL MEMBRANE PEEL (Right) PHOTOCOAGULATION WITH LASER (Right) - ENDOLASER PERFLUORONE INJECTION (Right)  Patient Location: PACU  Anesthesia Type:General  Level of Consciousness: awake  Airway and Oxygen Therapy: Patient Spontanous Breathing  Post-op Pain: mild  Post-op Assessment: Post-op Vital signs reviewed  Post-op Vital Signs: stable  Complications: No apparent anesthesia complications

## 2012-09-15 NOTE — Transfer of Care (Signed)
Immediate Anesthesia Transfer of Care Note  Patient: Kylie Harris  Procedure(s) Performed: Procedure(s) (LRB) with comments: PARS PLANA VITRECTOMY WITH 25 GAUGE (Right) - SILICONE OIL MEMBRANE PEEL (Right) PHOTOCOAGULATION WITH LASER (Right) - ENDOLASER PERFLUORONE INJECTION (Right)  Patient Location: PACU  Anesthesia Type:General  Level of Consciousness: awake, alert , oriented and sedated  Airway & Oxygen Therapy: Patient Spontanous Breathing and Patient connected to nasal cannula oxygen  Post-op Assessment: Report given to PACU RN, Post -op Vital signs reviewed and stable and Patient moving all extremities  Post vital signs: Reviewed and stable  Complications: No apparent anesthesia complications

## 2012-09-15 NOTE — OR Nursing (Signed)
Sleeping x 1 hour

## 2012-09-15 NOTE — Preoperative (Signed)
Beta Blockers   Reason not to administer Beta Blockers:Not Applicable 

## 2012-09-15 NOTE — Brief Op Note (Signed)
09/15/2012  9:41 AM  PATIENT:  Kylie Harris  57 y.o. female  PRE-OPERATIVE DIAGNOSIS:  TRACTION RETINAL DETACHMENT  POST-OPERATIVE DIAGNOSIS:  TRACTION RETINAL DETACHMENT  PROCEDURE:  Procedure(s) (LRB) with comments: PARS PLANA VITRECTOMY WITH 25 GAUGE (Right) - SILICONE OIL MEMBRANE PEEL (Right) PHOTOCOAGULATION WITH LASER (Right) - ENDOLASER PERFLUORONE INJECTION (Right), temporary,,,   Oil 5000 cs  SURGEON:  Surgeon(s) and Role:    * Edmon Crape, MD - Primary  PHYSICIAN ASSISTANT:   ASSISTANTS: none   ANESTHESIA:   general  EBL:  Total I/O In: 800 [I.V.:800] Out: -   BLOOD ADMINISTERED:none  DRAINS: none   LOCAL MEDICATIONS USED:  NONE  SPECIMEN:  No Specimen  DISPOSITION OF SPECIMEN:  N/A  COUNTS:  YES  TOURNIQUET:  * No tourniquets in log *  DICTATION: .Other Dictation: Dictation Number [id number]  PLAN OF CARE: Discharge to home after PACU  PATIENT DISPOSITION:  PACU - hemodynamically stable.   Delay start of Pharmacological VTE agent (>24hrs) due to surgical blood loss or risk of bleeding: yes

## 2012-09-16 ENCOUNTER — Encounter (HOSPITAL_COMMUNITY): Payer: Self-pay | Admitting: Ophthalmology

## 2012-09-16 NOTE — Op Note (Signed)
NAMESAORI, Kylie Harris              ACCOUNT NO.:  [id number]  MEDICAL RECORD NO.:  [id number]  LOCATION:                                 FACILITY:  PHYSICIAN:  Kylie Highland. Sundiata Ferrick, M.D.   DATE OF BIRTH:  July 24, 1956  DATE OF PROCEDURE:  09/15/2012 DATE OF DISCHARGE:                              OPERATIVE REPORT   PREOPERATIVE DIAGNOSES: 1. 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 2. Proliferative vitreal retinopathy, stage C3, macula off, right eye. 3. History of chronic retinal detachment, longstanding neglected which     redetached after primary scleral buckle and aphakic condition. 4. Recent pseudophakia.  POSTOPERATIVE DIAGNOSES: 1. 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 2. Proliferative vitreal retinopathy, stage C3, macula off, right eye. 3. History of chronic retinal detachment, longstanding neglected which     redetached after primary scleral buckle and aphakic condition. 4. Recent pseudophakia.  PROCEDURE:  Repair complex retinal detachment right eye via vitrectomy, membrane peel, injection of temporary vitreous substitute Perfluoron, endolaser photocoagulation, and injection of silicone oil 5000 centistokes, of the right eye.  SURGEON:  Kylie Highland. Janson Lamar, M.D.  ANESTHESIA:  General anesthesia.  INDICATIONS FOR PROCEDURE:  The patient is a 57 year old woman who has profound vision loss, right eye, requiring surgical intervention repair in order to preserve and protect the visual functioning of the right eye.  She understands the risks of anesthesia including, but not limited to hemorrhage, infection, scarring, need for another surgery, change in vision, loss of vision, progressive disease by intervention.  Proper site and consent was obtained.  Patient was taken to the operating room. In the operating room, appropriate monitors were followed with mild sedation.  General anesthesia induced without difficulty.  Right periocular  region was identified.  Surgical time-out and thereafter under anesthesia, sterile prep and drape, has been carried out in the typical ophthalmic fashion.  Thereafter, a lid speculum applied.  A 25- gauge trocar was placed in the inferotemporal quadrant.  Superior trocars were applied and the fusion had been verified in the vitreous cavity and then turned on.  Superior trocars applied.  Core traction was then begun.  Notable findings were that the posterior hyaloid not previously detached and so the posterior hyaloid was attached highly adhesively to the optic nerve region.  The inferior detachment noted from the 6 and 9 o'clock positions.  There is guttering up to the 12 o'clock positions superotemporally.  At this time, completion of vitrectomy was required to place Perfluoron over the posterior pole to stabilize.  Perfluoron was injected with a 27- gauge long needle over the __________ and posterior pole so as to stabilize posterior pole and allowed for vitreous base dissection 360 degrees without difficulty.  The retinal break was found at 6 o'clock and a small posterior retinotomy was planned.  We made posteriorly after removing the Perfluoron.  Fluid exchange carried out through the primary retinal break at the 6 o'clock position.  Thereafter, the fluid air exchange carried out over the slope of the buckle and anterior to the Perfluoron.  Subsequently, the Perfluoron could be removed and in doing so, a small amount of subretinal fluid, did gutter along the 6  o'clock meridian towards the optic nerve.  In this area, the small retinotomy was then created to allow for fluid air exchange.  The subretinal fluid was then removed in this fashion.  Endolaser photocoagulation was placed 360 degrees, slope of the buckle, around the breaks at 6 o'clock.  There was noted to be a smaller break at the 8 o'clock position and finally around the retinotomy site.  There was a small break also at the  10 o'clock position on the slope of the buckle.  These areas were all surrounded with a laser retinopexy internally.  At this time, decision was made to use silicone oil for long-term tamponade.  Air-silicone oil exchange carried out with an enlarged sclerotomy superotemporally.  The superotemporal sclerotomy was then closed with several Vicryl suture. Finally, appropriate field superotemporal sclerotomy closed with 7-0 Vicryl suture and finally the fusion site was closed with 7-0 Vicryl suture.  Conjunctivae was closed with 7-0 Vicryl, subconjunctival injection of Decadron applied.  Several patches of Fox shield applied. Patient tolerated the procedure without complications and taken to the PACU.     Kylie Harris, M.D.     GAR/MEDQ  D:  09/15/2012  T:  09/16/2012  Job:  [id number]

## 2012-11-07 ENCOUNTER — Encounter (HOSPITAL_COMMUNITY): Payer: Self-pay

## 2012-11-07 ENCOUNTER — Emergency Department (HOSPITAL_COMMUNITY)
Admission: EM | Admit: 2012-11-07 | Discharge: 2012-11-07 | Disposition: A | Discharge status: Home / Self Care | Payer: No Typology Code available for payment source | Source: Home / Self Care

## 2012-11-07 DIAGNOSIS — I10 Essential (primary) hypertension: Secondary | ICD-10-CM

## 2012-11-07 MED ORDER — CLONIDINE HCL 0.1 MG/24HR TD PTWK: 1.0000 | MEDICATED_PATCH | TRANSDERMAL | Status: DC

## 2012-11-07 MED ORDER — TELMISARTAN-HCTZ 40-12.5 MG PO TABS: 1.0000 | ORAL_TABLET | Freq: Every day | ORAL | Status: DC

## 2012-11-07 MED ORDER — AMLODIPINE BESYLATE 10 MG PO TABS: 10.0000 mg | ORAL_TABLET | Freq: Every day | ORAL | Status: DC

## 2012-11-07 MED ORDER — BETAMETHASONE DIPROPIONATE 0.05 % EX CREA: TOPICAL_CREAM | Freq: Two times a day (BID) | CUTANEOUS | Status: DC

## 2012-11-07 MED ORDER — METOPROLOL SUCCINATE ER 25 MG PO TB24: 25.0000 mg | ORAL_TABLET | Freq: Every day | ORAL | Status: DC

## 2012-11-07 NOTE — ED Notes (Signed)
Medication refill for hypertension medication.

## 2012-11-07 NOTE — ED Provider Notes (Signed)
History     CSN: [id number]  Arrival date & time 11/07/12  6257  57 year old female who presents to the urgent care for refill on her blood pressure medications as well as evaluation of right lower extremity rash. The patient has maculopapular rash with hyperpigmentation of her right lower extremity just above her ankle. Patient had an MRSA test done prior to her eye surgery which was negative. She has noticed this rash for about 2-3 months. It is extremely itchy. The patient denies any chest pain any shortness of breath, she had a creatinine checked in January and it was within normal limits    Chief Complaint  Patient presents with  . Medication Refill    (Consider location/radiation/quality/duration/timing/severity/associated sxs/prior treatment) HPI  Past Medical History  Diagnosis Date  . Hypertension     takes Metoprolol,Micardis,Amlodipine,and CLonidine daily  . Cataract     left eye  . Glaucoma   . Detached retina     right    Past Surgical History  Procedure Laterality Date  . Partial hysterectomy    . Scleral buckle with cryo  07/08/2012    Procedure: SCLERAL BUCKLE WITH CRYO;  Surgeon: Edmon Crape, MD;  Location: [facility] OR;  Service: Ophthalmology;  Laterality: Right;  CRYOPEXY  . Eye surgery      cataract removed from right  . Pars plana vitrectomy  09/15/2012    Procedure: PARS PLANA VITRECTOMY WITH 25 GAUGE;  Surgeon: Edmon Crape, MD;  Location: [facility] OR;  Service: Ophthalmology;  Laterality: Right;  SILICONE OIL  . Membrane peel  09/15/2012    Procedure: MEMBRANE PEEL;  Surgeon: Edmon Crape, MD;  Location: [facility] OR;  Service: Ophthalmology;  Laterality: Right;  . Photocoagulation with laser  09/15/2012    Procedure: PHOTOCOAGULATION WITH LASER;  Surgeon: Edmon Crape, MD;  Location: [facility] OR;  Service: Ophthalmology;  Laterality: Right;  ENDOLASER  . Perfluorone injection  09/15/2012    Procedure: PERFLUORONE INJECTION;  Surgeon: Edmon Crape, MD;  Location: [facility] OR;   Service: Ophthalmology;  Laterality: Right;    Family History  Problem Relation Age of Onset  . Hypertension Mother   . Heart failure Mother     History  Substance Use Topics  . Smoking status: Never Smoker   . Smokeless tobacco: Not on file  . Alcohol Use: 1.2 oz/week    2 Glasses of wine per week     Comment: states, "every now and then"    OB History   Grav Para Term Preterm Abortions TAB SAB Ect Mult Living   2    2  2    0      Review of Systems A complete 10 system review of systems was obtained and all systems are negative except as noted in the HPI and PMH  Allergies  Review of patient's allergies indicates no known allergies.  Home Medications   Current Outpatient Rx  Name  Route  Sig  Dispense  Refill  . amLODipine (NORVASC) 10 MG tablet   Oral   Take 1 tablet (10 mg total) by mouth daily.   30 tablet   2   . betamethasone dipropionate (DIPROLENE) 0.05 % cream   Topical   Apply topically 2 (two) times daily.   30 g   2   . cloNIDine (CATAPRES - DOSED IN MG/24 HR) 0.1 mg/24hr patch   Transdermal   Place 1 patch (0.1 mg total) onto the skin once a week. On Tuesdays  10 patch   2   . ibuprofen (ADVIL,MOTRIN) 200 MG tablet   Oral   Take 200 mg by mouth every 6 (six) hours as needed. For pain         . metoprolol succinate (TOPROL-XL) 25 MG 24 hr tablet   Oral   Take 1 tablet (25 mg total) by mouth daily.   30 tablet   2   . telmisartan-hydrochlorothiazide (MICARDIS HCT) 40-12.5 MG per tablet   Oral   Take 1 tablet by mouth daily.   30 tablet   2   . vitamin C (ASCORBIC ACID) 500 MG tablet   Oral   Take 500 mg by mouth daily.           BP 159/94  Pulse 71  Temp(Src) 98.9 F (37.2 C) (Oral)  Resp 18  SpO2 99%  Physical Exam Constitutional: She is oriented to person, place, and time. She appears well-developed and well-nourished.  HENT:  Head: Normocephalic and atraumatic.  Eyes: EOM are normal. Pupils are equal, round, and  reactive to light. Left conjunctiva is injected.  Right eye conjunctiva is swollen, erythematous and injected. Anterior chamber is clear. Pain with pupillary response on the right and left.  Neck: Normal range of motion. Neck supple.  Cardiovascular: Normal rate, normal heart sounds and intact distal pulses.  Pulmonary/Chest: Effort normal and breath sounds normal.  Abdominal: Bowel sounds are normal. She exhibits no distension. There is no tenderness.  Musculoskeletal: Normal range of motion. She exhibits no edema and no tenderness.  Neurological: She is alert and oriented to person, place, and time. She has normal strength. No cranial nerve deficit or sensory deficit.  Skin: maculopapular rash on the right lower extremity  Psychiatric: She has a normal mood and affect.   ED Course  Procedures (including critical care time)  Labs Reviewed - No data to display No results found.   No diagnosis found.    MDM  #1 rash-appears to becontact dermatitis-start the patient on betamethasone cream, topical to be applied for the next 2 months twice a day #2 hypertension patient has been provided with a refill on all her medications for about 2 months, and 2 months the patient would need a repeat BMP as well as a followup visit        Kylie Overlie, MD 11/07/12 1408

## 2012-11-10 NOTE — ED Notes (Signed)
Spoke with Olegario Messier at [facility] de[facility]- per dr Thedore Mins ok to switch from catapress patch to pill 0.1 bid

## 2012-11-28 ENCOUNTER — Encounter: Payer: Self-pay | Admitting: Family Medicine

## 2013-01-05 ENCOUNTER — Emergency Department (HOSPITAL_COMMUNITY)
Admission: EM | Admit: 2013-01-05 | Discharge: 2013-01-05 | Disposition: A | Discharge status: Home Health | Payer: No Typology Code available for payment source | Source: Home / Self Care

## 2013-01-05 ENCOUNTER — Encounter (HOSPITAL_COMMUNITY): Payer: Self-pay | Admitting: *Deleted

## 2013-01-05 DIAGNOSIS — I10 Essential (primary) hypertension: Secondary | ICD-10-CM

## 2013-01-05 MED ORDER — METOPROLOL SUCCINATE ER 25 MG PO TB24: 50.0000 mg | ORAL_TABLET | Freq: Every day | ORAL | Status: DC

## 2013-01-05 NOTE — Progress Notes (Signed)
Patient Demographics  Kylie Harris, is a 57 y.o. female  ZOX:[id number]  WJX:[id number]  DOB - [birth year]  No chief complaint on file.       Subjective:   Kylie Harris today is here for a follow up vist/to establish primary care. Patient has No headache, No chest pain, No abdominal pain - No Nausea, No new weakness tingling or numbness, No Cough - SOB. She is seeing Dr Luciana Axe for retinal detachment-and is now on prednisone-?reason  Objective:    Filed Vitals:   01/05/13 1159  BP: 156/87  Pulse: 65  Temp: 98.8 F (37.1 C)  TempSrc: Oral  Resp: 18  SpO2: 100%     ALLERGIES:  No Known Allergies  PAST MEDICAL HISTORY: Past Medical History  Diagnosis Date  . Hypertension     takes Metoprolol,Micardis,Amlodipine,and CLonidine daily  . Cataract     left eye  . Glaucoma   . Detached retina     right    MEDICATIONS AT HOME: Prior to Admission medications   Medication Sig Start Date End Date Taking? Authorizing Provider  amLODipine (NORVASC) 10 MG tablet Take 1 tablet (10 mg total) by mouth daily. 11/07/12   Richarda Overlie, MD  betamethasone dipropionate (DIPROLENE) 0.05 % cream Apply topically 2 (two) times daily. 11/07/12   Richarda Overlie, MD  cloNIDine (CATAPRES - DOSED IN MG/24 HR) 0.1 mg/24hr patch Place 1 patch (0.1 mg total) onto the skin once a week. On Tuesdays 11/07/12   Richarda Overlie, MD  ibuprofen (ADVIL,MOTRIN) 200 MG tablet Take 200 mg by mouth every 6 (six) hours as needed. For pain    Historical Provider, MD  metoprolol succinate (TOPROL-XL) 25 MG 24 hr tablet Take 2 tablets (50 mg total) by mouth daily. 01/05/13   Shanker Levora Dredge, MD  telmisartan-hydrochlorothiazide (MICARDIS HCT) 40-12.5 MG per tablet Take 1 tablet by mouth daily. 11/07/12   Richarda Overlie, MD  vitamin C (ASCORBIC ACID) 500 MG tablet Take 500 mg by mouth daily.    Historical Provider, MD     Exam  General appearance :Awake, alert, not in any distress. Speech Clear. Not toxic Looking HEENT:  Atraumatic and Normocephalic, pupils equally reactive to light and accomodation Neck: supple, no JVD. No cervical lymphadenopathy.  Chest:Good air entry bilaterally, no added sounds  CVS: S1 S2 regular, no murmurs.  Abdomen: Bowel sounds present, Non tender and not distended with no gaurding, rigidity or rebound. Extremities: B/L Lower Ext shows no edema, both legs are warm to touch Neurology: Awake alert, and oriented X 3, CN II-XII intact, Non focal Skin:No Rash Wounds:N/A    Data Review   CBC No results found for this basename: WBC, HGB, HCT, PLT, MCV, MCH, MCHC, RDW, NEUTRABS, LYMPHSABS, MONOABS, EOSABS, BASOSABS, BANDABS, BANDSABD,  in the last 168 hours  Chemistries   No results found for this basename: NA, K, CL, CO2, GLUCOSE, BUN, CREATININE, GFRCGP, CALCIUM, MG, AST, ALT, ALKPHOS, BILITOT,  in the last 168 hours ------------------------------------------------------------------------------------------------------------------ No results found for this basename: HGBA1C,  in the last 72 hours ------------------------------------------------------------------------------------------------------------------ No results found for this basename: CHOL, HDL, LDLCALC, TRIG, CHOLHDL, LDLDIRECT,  in the last 72 hours ------------------------------------------------------------------------------------------------------------------ No results found for this basename: TSH, T4TOTAL, FREET3, T3FREE, THYROIDAB,  in the last 72 hours ------------------------------------------------------------------------------------------------------------------ No results found for this basename: VITAMINB12, FOLATE, FERRITIN, TIBC, IRON, RETICCTPCT,  in the last 72 hours  Coagulation profile  No results found for this basename: INR, PROTIME,  in the last 168 hours  Assessment & Plan   Active Problems: HTN -increase Toprol to 50 mg daily -have asked patient to bring her medications next time-to see  what dose of clonidine she is on-not on transdermal patch -c/w amlodipine and Micardis/HCTZ  Retinal Detachment -see's Dr Luciana Axe -apparently on steroids-that is being managed by Dr Luciana Axe -???-need to get records   [facility] -claims last mammography 1 year back-normal -needs a screening colonoscopy-will have to refer next visit-for now will get blood work and PAP smear first  Follow up in 6 weeks-need to get blood work prior to next visit-have ordered-please follow     Follow-up Information   Follow up with [facility]. Schedule an appointment as soon as possible for a visit in 2 weeks.   Contact information:   [address] [place] [address]       Follow up with [facility] In 6 weeks.

## 2013-01-05 NOTE — ED Notes (Signed)
Medication refills

## 2013-01-05 NOTE — ED Notes (Signed)
[facility]

## 2013-01-17 ENCOUNTER — Ambulatory Visit: Payer: No Typology Code available for payment source | Attending: Family Medicine | Admitting: Internal Medicine

## 2013-01-17 VITALS — BP 145/90 | HR 67 | Temp 98.3°F | Resp 16 | Wt 188.0 lb

## 2013-01-17 DIAGNOSIS — I10 Essential (primary) hypertension: Secondary | ICD-10-CM

## 2013-01-17 MED ORDER — HYDROCORTISONE 1 % EX OINT: TOPICAL_OINTMENT | Freq: Two times a day (BID) | CUTANEOUS | Status: DC

## 2013-01-17 NOTE — Progress Notes (Signed)
Patient ID: Kylie Harris, female   DOB: 06-Aug-1956, 58 y.o.   MRN: [id number]

## 2013-01-17 NOTE — Patient Instructions (Signed)
Rash  A rash is a change in the color or texture of your skin. There are many different types of rashes. You may have other problems that accompany your rash.  CAUSES     Infections.   Allergic reactions. This can include allergies to pets or foods.   Certain medicines.   Exposure to certain chemicals, soaps, or cosmetics.   Heat.   Exposure to poisonous plants.   Tumors, both cancerous and noncancerous.  SYMPTOMS     Redness.   Scaly skin.   Itchy skin.   Dry or cracked skin.   Bumps.   Blisters.   Pain.  DIAGNOSIS   Your caregiver may do a physical exam to determine what type of rash you have. A skin sample (biopsy) may be taken and examined under a microscope.  TREATMENT    Treatment depends on the type of rash you have. Your caregiver may prescribe certain medicines. For serious conditions, you may need to see a skin doctor (dermatologist).  HOME CARE INSTRUCTIONS     Avoid the substance that caused your rash.   Do not scratch your rash. This can cause infection.   You may take cool baths to help stop itching.   Only take over-the-counter or prescription medicines as directed by your caregiver.   Keep all follow-up appointments as directed by your caregiver.  SEEK IMMEDIATE MEDICAL CARE IF:   You have increasing pain, swelling, or redness.   You have a fever.   You have new or severe symptoms.   You have body aches, diarrhea, or vomiting.   Your rash is not better after 3 days.  MAKE SURE YOU:   Understand these instructions.   Will watch your condition.   Will get help right away if you are not doing well or get worse.  Document Released: 08/14/2002 Document Revised: 11/16/2011 Document Reviewed: 06/08/2011  [employer] Patient Information 2013 [employer]

## 2013-01-17 NOTE — Progress Notes (Signed)
Patient complains of rash to lower right leg Has some issues to talk with doctor about

## 2013-01-23 ENCOUNTER — Encounter: Payer: Self-pay | Admitting: Internal Medicine

## 2013-01-23 NOTE — Progress Notes (Signed)
Patient ID: Kylie Harris, female   DOB: 22-Apr-1956, 57 y.o.   MRN: [id number]  CC: BP check  HPI: Pt is 57 yo female with HTN who presents to clinic for follow upon BP control. She denies recent sicknesses or hospitalizations, no chest pain or shortness of breath, no abdominal or urinary concerns. Pt reports compliance with medications and wants to know if we can lower the dose.  No Known Allergies Past Medical History  Diagnosis Date  . Hypertension     takes Metoprolol,Micardis,Amlodipine,and CLonidine daily  . Cataract     left eye  . Glaucoma   . Detached retina     right   No current facility-administered medications on file prior to encounter.   Current Outpatient Prescriptions on File Prior to Encounter  Medication Sig Dispense Refill  . amLODipine (NORVASC) 10 MG tablet Take 1 tablet (10 mg total) by mouth daily.  30 tablet  2  . betamethasone dipropionate (DIPROLENE) 0.05 % cream Apply topically 2 (two) times daily.  30 g  2  . cloNIDine (CATAPRES - DOSED IN MG/24 HR) 0.1 mg/24hr patch Place 1 patch (0.1 mg total) onto the skin once a week. On Tuesdays  10 patch  2  . ibuprofen (ADVIL,MOTRIN) 200 MG tablet Take 200 mg by mouth every 6 (six) hours as needed. For pain      . telmisartan-hydrochlorothiazide (MICARDIS HCT) 40-12.5 MG per tablet Take 1 tablet by mouth daily.  30 tablet  2  . vitamin C (ASCORBIC ACID) 500 MG tablet Take 500 mg by mouth daily.       Family History  Problem Relation Age of Onset  . Hypertension Mother   . Heart failure Mother    History   Social History  . Marital Status: Single    Spouse Name: N/A    Number of Children: N/A  . Years of Education: N/A   Occupational History  . Not on file.   Social History Main Topics  . Smoking status: Never Smoker   . Smokeless tobacco: Not on file  . Alcohol Use: 1.2 oz/week    2 Glasses of wine per week     Comment: states, "every now and then"  . Drug Use: No  . Sexually Active: Yes   Birth Control/ Protection: Surgical   Other Topics Concern  . Not on file   Social History Narrative  . No narrative on file    Review of Systems  Constitutional: Negative for fever, chills, diaphoresis, activity change, appetite change and fatigue.  HENT: Negative for ear pain, nosebleeds, congestion, facial swelling, rhinorrhea, neck pain, neck stiffness and ear discharge.   Eyes: Negative for pain, discharge, redness, itching and visual disturbance.  Respiratory: Negative for cough, choking, chest tightness, shortness of breath, wheezing and stridor.   Cardiovascular: Negative for chest pain, palpitations and leg swelling.  Gastrointestinal: Negative for abdominal distention.  Genitourinary: Negative for dysuria, urgency, frequency, hematuria, flank pain, decreased urine volume, difficulty urinating and dyspareunia.  Musculoskeletal: Negative for back pain, joint swelling, arthralgias and gait problem.  Neurological: Negative for dizziness, tremors, seizures, syncope, facial asymmetry, speech difficulty, weakness, light-headedness, numbness and headaches.  Hematological: Negative for adenopathy. Does not bruise/bleed easily.  Psychiatric/Behavioral: Negative for hallucinations, behavioral problems, confusion, dysphoric mood, decreased concentration and agitation.    Objective:   Filed Vitals:   01/05/13 1159  BP: 156/87  Pulse: 65  Temp: 98.8 F (37.1 C)  Resp: 18    Physical Exam  Constitutional:  Appears well-developed and well-nourished. No distress.  HENT: Normocephalic. External right and left ear normal. Oropharynx is clear and moist.  Eyes: Conjunctivae and EOM are normal. PERRLA, no scleral icterus.  Neck: Normal ROM. Neck supple. No JVD. No tracheal deviation. No thyromegaly.  CVS: RRR, S1/S2 +, no murmurs, no gallops, no carotid bruit.  Pulmonary: Effort and breath sounds normal, no stridor, rhonchi, wheezes, rales.  Abdominal: Soft. BS +,  no distension,  tenderness, rebound or guarding.  Musculoskeletal: Normal range of motion. No edema and no tenderness.  Lymphadenopathy: No lymphadenopathy noted, cervical, inguinal. Neuro: Alert. Normal reflexes, muscle tone coordination. No cranial nerve deficit. Skin: Skin is warm and dry. No rash noted. Not diaphoretic. No erythema. No pallor.  Psychiatric: Normal mood and affect. Behavior, judgment, thought content normal.   Lab Results  Component Value Date   WBC 7.8 09/08/2012   HGB 12.8 09/08/2012   HCT 40.0 09/08/2012   MCV 82.8 09/08/2012   PLT 489* 09/08/2012   Lab Results  Component Value Date   CREATININE 0.64 09/08/2012   BUN 15 09/08/2012   NA 138 09/08/2012   K 4.2 09/08/2012   CL 98 09/08/2012   CO2 28 09/08/2012    No results found for this basename: HGBA1C        Assessment:   Patient Active Problem List   Diagnosis Date Noted  . THYROID NODULE 03/11/2010  . ERUCTATION 01/27/2008  . HYPERTENSION 05/19/2007        Plan:     - continue to monitor BP regularly and call us back if the BP is > 140/90 - discussed importance of exercise and diet  - discussed compliance with medical therapy and screening colonoscopy, mammogram, PAP smear

## 2013-02-16 ENCOUNTER — Ambulatory Visit (INDEPENDENT_AMBULATORY_CARE_PROVIDER_SITE_OTHER): Payer: No Typology Code available for payment source | Admitting: Obstetrics & Gynecology

## 2013-02-16 ENCOUNTER — Encounter: Payer: Self-pay | Admitting: Obstetrics & Gynecology

## 2013-02-16 ENCOUNTER — Encounter: Payer: No Typology Code available for payment source | Admitting: Obstetrics and Gynecology

## 2013-02-16 VITALS — BP 169/90 | HR 60 | Temp 97.6°F | Ht 62.0 in | Wt 184.4 lb

## 2013-02-16 DIAGNOSIS — Z1231 Encounter for screening mammogram for malignant neoplasm of breast: Secondary | ICD-10-CM

## 2013-02-16 DIAGNOSIS — Z01419 Encounter for gynecological examination (general) (routine) without abnormal findings: Secondary | ICD-10-CM

## 2013-02-16 MED ORDER — CITALOPRAM HYDROBROMIDE 20 MG PO TABS: 20.0000 mg | ORAL_TABLET | Freq: Every day | ORAL | Status: DC

## 2013-02-16 NOTE — Patient Instructions (Signed)
Preventive Care for Adults, Female A healthy lifestyle and preventive care can promote health and wellness. Preventive health guidelines for women include the following key practices.  A routine yearly physical is a good way to check with your caregiver about your health and preventive screening. It is a chance to share any concerns and updates on your health, and to receive a thorough exam.  Visit your dentist for a routine exam and preventive care every 6 months. Brush your teeth twice a day and floss once a day. Good oral hygiene prevents tooth decay and gum disease.  The frequency of eye exams is based on your age, health, family medical history, use of contact lenses, and other factors. Follow your caregiver's recommendations for frequency of eye exams.  Eat a healthy diet. Foods like vegetables, fruits, whole grains, low-fat dairy products, and lean protein foods contain the nutrients you need without too many calories. Decrease your intake of foods high in solid fats, added sugars, and salt. Eat the right amount of calories for you.Get information about a proper diet from your caregiver, if necessary.  Regular physical exercise is one of the most important things you can do for your health. Most adults should get at least 150 minutes of moderate-intensity exercise (any activity that increases your heart rate and causes you to sweat) each week. In addition, most adults need muscle-strengthening exercises on 2 or more days a week.  Maintain a healthy weight. The body mass index (BMI) is a screening tool to identify possible weight problems. It provides an estimate of body fat based on height and weight. Your caregiver can help determine your BMI, and can help you achieve or maintain a healthy weight.For adults 20 years and older:  A BMI below 18.5 is considered underweight.  A BMI of 18.5 to 24.9 is normal.  A BMI of 25 to 29.9 is considered overweight.  A BMI of 30 and above is  considered obese.  Maintain normal blood lipids and cholesterol levels by exercising and minimizing your intake of saturated fat. Eat a balanced diet with plenty of fruit and vegetables. Blood tests for lipids and cholesterol should begin at age 20 and be repeated every 5 years. If your lipid or cholesterol levels are high, you are over 50, or you are at high risk for heart disease, you may need your cholesterol levels checked more frequently.Ongoing high lipid and cholesterol levels should be treated with medicines if diet and exercise are not effective.  If you smoke, find out from your caregiver how to quit. If you do not use tobacco, do not start.  If you are pregnant, do not drink alcohol. If you are breastfeeding, be very cautious about drinking alcohol. If you are not pregnant and choose to drink alcohol, do not exceed 1 drink per day. One drink is considered to be 12 ounces (355 mL) of beer, 5 ounces (148 mL) of wine, or 1.5 ounces (44 mL) of liquor.  Avoid use of street drugs. Do not share needles with anyone. Ask for help if you need support or instructions about stopping the use of drugs.  High blood pressure causes heart disease and increases the risk of stroke. Your blood pressure should be checked at least every 1 to 2 years. Ongoing high blood pressure should be treated with medicines if weight loss and exercise are not effective.  If you are 55 to 56 years old, ask your caregiver if you should take aspirin to prevent strokes.  Diabetes   screening involves taking a blood sample to check your fasting blood sugar level. This should be done once every 3 years, after age 45, if you are within normal weight and without risk factors for diabetes. Testing should be considered at a younger age or be carried out more frequently if you are overweight and have at least 1 risk factor for diabetes.  Breast cancer screening is essential preventive care for women. You should practice "breast  self-awareness." This means understanding the normal appearance and feel of your breasts and may include breast self-examination. Any changes detected, no matter how small, should be reported to a caregiver. Women in their 20s and 30s should have a clinical breast exam (CBE) by a caregiver as part of a regular health exam every 1 to 3 years. After age 40, women should have a CBE every year. Starting at age 40, women should consider having a mammography (breast X-ray test) every year. Women who have a family history of breast cancer should talk to their caregiver about genetic screening. Women at a high risk of breast cancer should talk to their caregivers about having magnetic resonance imaging (MRI) and a mammography every year.  The Pap test is a screening test for cervical cancer. A Pap test can show cell changes on the cervix that might become cervical cancer if left untreated. A Pap test is a procedure in which cells are obtained and examined from the lower end of the uterus (cervix).  Women should have a Pap test starting at age 21.  Between ages 21 and 29, Pap tests should be repeated every 2 years.  Beginning at age 30, you should have a Pap test every 3 years as long as the past 3 Pap tests have been normal.  Some women have medical problems that increase the chance of getting cervical cancer. Talk to your caregiver about these problems. It is especially important to talk to your caregiver if a new problem develops soon after your last Pap test. In these cases, your caregiver may recommend more frequent screening and Pap tests.  The above recommendations are the same for women who have or have not gotten the vaccine for human papillomavirus (HPV).  If you had a hysterectomy for a problem that was not cancer or a condition that could lead to cancer, then you no longer need Pap tests. Even if you no longer need a Pap test, a regular exam is a good idea to make sure no other problems are  starting.  If you are between ages 65 and 70, and you have had normal Pap tests going back 10 years, you no longer need Pap tests. Even if you no longer need a Pap test, a regular exam is a good idea to make sure no other problems are starting.  If you have had past treatment for cervical cancer or a condition that could lead to cancer, you need Pap tests and screening for cancer for at least 20 years after your treatment.  If Pap tests have been discontinued, risk factors (such as a new sexual partner) need to be reassessed to determine if screening should be resumed.  The HPV test is an additional test that may be used for cervical cancer screening. The HPV test looks for the virus that can cause the cell changes on the cervix. The cells collected during the Pap test can be tested for HPV. The HPV test could be used to screen women aged 30 years and older, and should   be used in women of any age who have unclear Pap test results. After the age of 30, women should have HPV testing at the same frequency as a Pap test.  Colorectal cancer can be detected and often prevented. Most routine colorectal cancer screening begins at the age of 50 and continues through age 75. However, your caregiver may recommend screening at an earlier age if you have risk factors for colon cancer. On a yearly basis, your caregiver may provide home test kits to check for hidden blood in the stool. Use of a small camera at the end of a tube, to directly examine the colon (sigmoidoscopy or colonoscopy), can detect the earliest forms of colorectal cancer. Talk to your caregiver about this at age 50, when routine screening begins. Direct examination of the colon should be repeated every 5 to 10 years through age 75, unless early forms of pre-cancerous polyps or small growths are found.  Hepatitis C blood testing is recommended for all people born from 1945 through 1965 and any individual with known risks for hepatitis C.  Practice  safe sex. Use condoms and avoid high-risk sexual practices to reduce the spread of sexually transmitted infections (STIs). STIs include gonorrhea, chlamydia, syphilis, trichomonas, herpes, HPV, and human immunodeficiency virus (HIV). Herpes, HIV, and HPV are viral illnesses that have no cure. They can result in disability, cancer, and death. Sexually active women aged 25 and younger should be checked for chlamydia. Older women with new or multiple partners should also be tested for chlamydia. Testing for other STIs is recommended if you are sexually active and at increased risk.  Osteoporosis is a disease in which the bones lose minerals and strength with aging. This can result in serious bone fractures. The risk of osteoporosis can be identified using a bone density scan. Women ages 65 and over and women at risk for fractures or osteoporosis should discuss screening with their caregivers. Ask your caregiver whether you should take a calcium supplement or vitamin D to reduce the rate of osteoporosis.  Menopause can be associated with physical symptoms and risks. Hormone replacement therapy is available to decrease symptoms and risks. You should talk to your caregiver about whether hormone replacement therapy is right for you.  Use sunscreen with sun protection factor (SPF) of 30 or more. Apply sunscreen liberally and repeatedly throughout the day. You should seek shade when your shadow is shorter than you. Protect yourself by wearing long sleeves, pants, a wide-brimmed hat, and sunglasses year round, whenever you are outdoors.  Once a month, do a whole body skin exam, using a mirror to look at the skin on your back. Notify your caregiver of new moles, moles that have irregular borders, moles that are larger than a pencil eraser, or moles that have changed in shape or color.  Stay current with required immunizations.  Influenza. You need a dose every fall (or winter). The composition of the flu vaccine  changes each year, so being vaccinated once is not enough.  Pneumococcal polysaccharide. You need 1 to 2 doses if you smoke cigarettes or if you have certain chronic medical conditions. You need 1 dose at age 65 (or older) if you have never been vaccinated.  Tetanus, diphtheria, pertussis (Tdap, Td). Get 1 dose of Tdap vaccine if you are younger than age 65, are over 65 and have contact with an infant, are a healthcare worker, are pregnant, or simply want to be protected from whooping cough. After that, you need a Td   booster dose every 10 years. Consult your caregiver if you have not had at least 3 tetanus and diphtheria-containing shots sometime in your life or have a deep or dirty wound.  HPV. You need this vaccine if you are a woman age 26 or younger. The vaccine is given in 3 doses over 6 months.  Measles, mumps, rubella (MMR). You need at least 1 dose of MMR if you were born in 1957 or later. You may also need a second dose.  Meningococcal. If you are age 19 to 21 and a first-year college student living in a residence hall, or have one of several medical conditions, you need to get vaccinated against meningococcal disease. You may also need additional booster doses.  Zoster (shingles). If you are age 60 or older, you should get this vaccine.  Varicella (chickenpox). If you have never had chickenpox or you were vaccinated but received only 1 dose, talk to your caregiver to find out if you need this vaccine.  Hepatitis A. You need this vaccine if you have a specific risk factor for hepatitis A virus infection or you simply wish to be protected from this disease. The vaccine is usually given as 2 doses, 6 to 18 months apart.  Hepatitis B. You need this vaccine if you have a specific risk factor for hepatitis B virus infection or you simply wish to be protected from this disease. The vaccine is given in 3 doses, usually over 6 months. Preventive Services / Frequency Ages 19 to 39  Blood  pressure check.** / Every 1 to 2 years.  Lipid and cholesterol check.** / Every 5 years beginning at age 20.  Clinical breast exam.** / Every 3 years for women in their 20s and 30s.  Pap test.** / Every 2 years from ages 21 through 29. Every 3 years starting at age 30 through age 65 or 70 with a history of 3 consecutive normal Pap tests.  HPV screening.** / Every 3 years from ages 30 through ages 65 to 70 with a history of 3 consecutive normal Pap tests.  Hepatitis C blood test.** / For any individual with known risks for hepatitis C.  Skin self-exam. / Monthly.  Influenza immunization.** / Every year.  Pneumococcal polysaccharide immunization.** / 1 to 2 doses if you smoke cigarettes or if you have certain chronic medical conditions.  Tetanus, diphtheria, pertussis (Tdap, Td) immunization. / A one-time dose of Tdap vaccine. After that, you need a Td booster dose every 10 years.  HPV immunization. / 3 doses over 6 months, if you are 26 and younger.  Measles, mumps, rubella (MMR) immunization. / You need at least 1 dose of MMR if you were born in 1957 or later. You may also need a second dose.  Meningococcal immunization. / 1 dose if you are age 19 to 21 and a first-year college student living in a residence hall, or have one of several medical conditions, you need to get vaccinated against meningococcal disease. You may also need additional booster doses.  Varicella immunization.** / Consult your caregiver.  Hepatitis A immunization.** / Consult your caregiver. 2 doses, 6 to 18 months apart.  Hepatitis B immunization.** / Consult your caregiver. 3 doses usually over 6 months. Ages 40 to 64  Blood pressure check.** / Every 1 to 2 years.  Lipid and cholesterol check.** / Every 5 years beginning at age 20.  Clinical breast exam.** / Every year after age 40.  Mammogram.** / Every year beginning at age 40   and continuing for as long as you are in good health. Consult with your  caregiver.  Pap test.** / Every 3 years starting at age 30 through age 65 or 70 with a history of 3 consecutive normal Pap tests.  HPV screening.** / Every 3 years from ages 30 through ages 65 to 70 with a history of 3 consecutive normal Pap tests.  Fecal occult blood test (FOBT) of stool. / Every year beginning at age 50 and continuing until age 75. You may not need to do this test if you get a colonoscopy every 10 years.  Flexible sigmoidoscopy or colonoscopy.** / Every 5 years for a flexible sigmoidoscopy or every 10 years for a colonoscopy beginning at age 50 and continuing until age 75.  Hepatitis C blood test.** / For all people born from 1945 through 1965 and any individual with known risks for hepatitis C.  Skin self-exam. / Monthly.  Influenza immunization.** / Every year.  Pneumococcal polysaccharide immunization.** / 1 to 2 doses if you smoke cigarettes or if you have certain chronic medical conditions.  Tetanus, diphtheria, pertussis (Tdap, Td) immunization.** / A one-time dose of Tdap vaccine. After that, you need a Td booster dose every 10 years.  Measles, mumps, rubella (MMR) immunization. / You need at least 1 dose of MMR if you were born in 1957 or later. You may also need a second dose.  Varicella immunization.** / Consult your caregiver.  Meningococcal immunization.** / Consult your caregiver.  Hepatitis A immunization.** / Consult your caregiver. 2 doses, 6 to 18 months apart.  Hepatitis B immunization.** / Consult your caregiver. 3 doses, usually over 6 months. Ages 65 and over  Blood pressure check.** / Every 1 to 2 years.  Lipid and cholesterol check.** / Every 5 years beginning at age 20.  Clinical breast exam.** / Every year after age 40.  Mammogram.** / Every year beginning at age 40 and continuing for as long as you are in good health. Consult with your caregiver.  Pap test.** / Every 3 years starting at age 30 through age 65 or 70 with a 3  consecutive normal Pap tests. Testing can be stopped between 65 and 70 with 3 consecutive normal Pap tests and no abnormal Pap or HPV tests in the past 10 years.  HPV screening.** / Every 3 years from ages 30 through ages 65 or 70 with a history of 3 consecutive normal Pap tests. Testing can be stopped between 65 and 70 with 3 consecutive normal Pap tests and no abnormal Pap or HPV tests in the past 10 years.  Fecal occult blood test (FOBT) of stool. / Every year beginning at age 50 and continuing until age 75. You may not need to do this test if you get a colonoscopy every 10 years.  Flexible sigmoidoscopy or colonoscopy.** / Every 5 years for a flexible sigmoidoscopy or every 10 years for a colonoscopy beginning at age 50 and continuing until age 75.  Hepatitis C blood test.** / For all people born from 1945 through 1965 and any individual with known risks for hepatitis C.  Osteoporosis screening.** / A one-time screening for women ages 65 and over and women at risk for fractures or osteoporosis.  Skin self-exam. / Monthly.  Influenza immunization.** / Every year.  Pneumococcal polysaccharide immunization.** / 1 dose at age 65 (or older) if you have never been vaccinated.  Tetanus, diphtheria, pertussis (Tdap, Td) immunization. / A one-time dose of Tdap vaccine if you are over   65 and have contact with an infant, are a healthcare worker, or simply want to be protected from whooping cough. After that, you need a Td booster dose every 10 years.  Varicella immunization.** / Consult your caregiver.  Meningococcal immunization.** / Consult your caregiver.  Hepatitis A immunization.** / Consult your caregiver. 2 doses, 6 to 18 months apart.  Hepatitis B immunization.** / Check with your caregiver. 3 doses, usually over 6 months. ** Family history and personal history of risk and conditions may change your caregiver's recommendations. Document Released: 10/20/2001 Document Revised: 11/16/2011  Document Reviewed: 01/19/2011 [employer] Patient Information 2014 [employer].  

## 2013-02-16 NOTE — Progress Notes (Signed)
  Subjective:     Kylie Harris is a 57 y.o. [id number] female and is here for a comprehensive gynecologic exam. The patient reports no problems.  Reports having hysterectomy in the 1990s for fibroids and bleeding.  Not sexually active.  Reports debilitating hot flashes and night sweats, wants medication to help with these symptoms.  No other GYN concerns.  History   Social History  . Marital Status: Single    Spouse Name: N/A    Number of Children: N/A  . Years of Education: N/A   Occupational History  . Not on file.   Social History Main Topics  . Smoking status: Never Smoker   . Smokeless tobacco: Not on file  . Alcohol Use: 1.2 oz/week    2 Glasses of wine per week     Comment: states, "every now and then"  . Drug Use: No  . Sexually Active: Yes    Birth Control/ Protection: Surgical   Other Topics Concern  . Not on file   Social History Narrative  . No narrative on file   Health Maintenance  Topic Date Due  . Colonoscopy  05/09/2007  . Mammogram  04/14/2012  . Influenza Vaccine  05/08/2013  . Pap Smear  12/17/2014  . Tetanus/tdap  09/14/2016    The following portions of the patient's history were reviewed and updated as appropriate: allergies, current medications, past family history, past medical history, past social history, past surgical history and problem list.  Review of Systems Pertinent items are noted in HPI.   Objective:     BP 169/90  Pulse 60  Temp(Src) 97.6 F (36.4 C) (Oral)  Ht 5\' 2"  (1.575 m)  Wt 184 lb 6.4 oz (83.643 kg)  BMI 33.72 kg/m2 GENERAL: Well-developed, well-nourished female in no acute distress.  HEENT: Normocephalic, atraumatic. Sclerae anicteric.  NECK: Supple. Normal thyroid.  LUNGS: Clear to auscultation bilaterally.  HEART: Regular rate and rhythm. BREASTS: Symmetric in size. No masses, skin changes, nipple drainage, or lymphadenopathy. ABDOMEN: Soft, nontender, nondistended. No organomegaly. Well-healed Pfannenstiel  incision PELVIC: Normal external female genitalia. Vagina is pink and rugated.  Normal discharge. Normal vaginal cuff. No pelvic, adnexal mass or tenderness.  EXTREMITIES: No cyanosis, clubbing, or edema, 2+ distal pulses.   Assessment:    Healthy female exam   Plan:   Citalopram prescribed to see if this helps with vasomotor symptoms; patient reports she is unable to afford Effexor or Gabapentin Follow up with PCP for other medical issues Mammogram scheduled Routine preventative health maintenance measures emphasized

## 2013-02-20 ENCOUNTER — Encounter: Payer: Self-pay | Admitting: Internal Medicine

## 2013-02-20 ENCOUNTER — Ambulatory Visit: Payer: No Typology Code available for payment source | Attending: Family Medicine | Admitting: Internal Medicine

## 2013-02-20 VITALS — BP 124/79 | HR 63 | Temp 97.9°F | Resp 16 | Ht 64.0 in | Wt 188.0 lb

## 2013-02-20 DIAGNOSIS — H33019 Retinal detachment with single break, unspecified eye: Secondary | ICD-10-CM

## 2013-02-20 DIAGNOSIS — H33011 Retinal detachment with single break, right eye: Secondary | ICD-10-CM | POA: Insufficient documentation

## 2013-02-20 DIAGNOSIS — H3320 Serous retinal detachment, unspecified eye: Secondary | ICD-10-CM | POA: Insufficient documentation

## 2013-02-20 MED ORDER — AMLODIPINE BESYLATE 10 MG PO TABS: 10.0000 mg | ORAL_TABLET | Freq: Every day | ORAL | Status: DC

## 2013-02-20 MED ORDER — METOPROLOL SUCCINATE ER 25 MG PO TB24: 50.0000 mg | ORAL_TABLET | Freq: Every day | ORAL | Status: DC

## 2013-02-20 MED ORDER — CLONIDINE HCL 0.1 MG PO TABS: 0.1000 mg | ORAL_TABLET | Freq: Two times a day (BID) | ORAL | Status: DC

## 2013-02-20 MED ORDER — TELMISARTAN-HCTZ 40-12.5 MG PO TABS: 1.0000 | ORAL_TABLET | Freq: Every day | ORAL | Status: DC

## 2013-02-20 NOTE — Addendum Note (Signed)
Addended by: Leroy Sea on: 02/20/2013 12:07 PM   Modules accepted: Orders

## 2013-02-20 NOTE — Addendum Note (Signed)
Addended by: Leroy Sea on: 02/20/2013 12:14 PM   Modules accepted: Orders, Medications

## 2013-02-20 NOTE — Progress Notes (Signed)
[facility] mamogram scheduled for next week

## 2013-02-20 NOTE — Progress Notes (Deleted)
Subjective:     Patient ID: Kylie Harris, female   DOB: 30-Jan-1956, 57 y.o.   MRN: [id number]  HPI   Review of Systems     Objective:   Physical Exam     Assessment:     ***    Plan:     ***

## 2013-02-20 NOTE — Progress Notes (Signed)
Patient Demographics  Kylie Harris, is a 57 y.o. female  ZOX:[id number]  [id number]:[id number]  DOB - June 22, 1956  Chief Complaint  Patient presents with  . Follow-up        Subjective:   Kylie Harris with history of hypertension, right eye retinal detachment, left eye likely glaucoma, comes in to establish care and to get a referral to a new ophthalmologist, she was following up with ophthalmologist who has now asked her to find another specialist as her insurance would not cover him. He was following her for retinal detachment and had also placed her on 60 mg of daily prednisone for problems with her left eye. Patient does not know for sure what kind of problems she hasn't had her period  Objective:   Past Medical History  Diagnosis Date  . Hypertension     takes Metoprolol,Micardis,Amlodipine,and CLonidine daily  . Cataract     left eye  . Glaucoma   . Detached retina     right      Past Surgical History  Procedure Laterality Date  . Total abdominal hysterectomy  1990    Done for fibroids and bleeding  . Scleral buckle with cryo  07/08/2012    Procedure: SCLERAL BUCKLE WITH CRYO;  Surgeon: Edmon Crape, MD;  Location: [facility] OR;  Service: Ophthalmology;  Laterality: Right;  CRYOPEXY  . Eye surgery      cataract removed from right  . Pars plana vitrectomy  09/15/2012    Procedure: PARS PLANA VITRECTOMY WITH 25 GAUGE;  Surgeon: Edmon Crape, MD;  Location: [facility] OR;  Service: Ophthalmology;  Laterality: Right;  SILICONE OIL  . Membrane peel  09/15/2012    Procedure: MEMBRANE PEEL;  Surgeon: Edmon Crape, MD;  Location: [facility] OR;  Service: Ophthalmology;  Laterality: Right;  . Photocoagulation with laser  09/15/2012    Procedure: PHOTOCOAGULATION WITH LASER;  Surgeon: Edmon Crape, MD;  Location: [facility] OR;  Service: Ophthalmology;  Laterality: Right;  ENDOLASER  . Perfluorone injection  09/15/2012    Procedure: PERFLUORONE INJECTION;  Surgeon: Edmon Crape, MD;  Location: [facility] OR;  Service:  Ophthalmology;  Laterality: Right;     Filed Vitals:   02/20/13 1150  BP: 124/79  Pulse: 63  Temp: 97.9 F (36.6 C)  Resp: 16  Height: 5\' 4"  (1.626 m)  Weight: 188 lb (85.276 kg)  SpO2: 98%     Exam  Awake Alert, Oriented X 3, No new F.N deficits, Normal affect [place].AT,PERRAL Supple Neck,No JVD, No cervical lymphadenopathy appriciated.  Symmetrical Chest wall movement, Good air movement bilaterally, CTAB RRR,No Gallops,Rubs or new Murmurs, No Parasternal Heave +ve B.Sounds, Abd Soft, Non tender, No organomegaly appriciated, No rebound - guarding or rigidity. No Cyanosis, Clubbing or edema, No new Rash or bruise      Data Review   CBC No results found for this basename: WBC, HGB, HCT, PLT, MCV, MCH, MCHC, RDW, NEUTRABS, LYMPHSABS, MONOABS, EOSABS, BASOSABS, BANDABS, BANDSABD,  in the last 168 hours  Chemistries   No results found for this basename: NA, K, CL, CO2, GLUCOSE, BUN, CREATININE, GFRCGP, CALCIUM, MG, AST, ALT, ALKPHOS, BILITOT,  in the last 168 hours ------------------------------------------------------------------------------------------------------------------ No results found for this basename: HGBA1C,  in the last 72 hours ------------------------------------------------------------------------------------------------------------------ No results found for this basename: CHOL, HDL, LDLCALC, TRIG, CHOLHDL, LDLDIRECT,  in the last 72 hours ------------------------------------------------------------------------------------------------------------------ No results found for this basename: TSH, T4TOTAL, FREET3, T3FREE, THYROIDAB,  in the last 72 hours ------------------------------------------------------------------------------------------------------------------ No results found for  this basename: VITAMINB12, FOLATE, FERRITIN, TIBC, IRON, RETICCTPCT,  in the last 72 hours  Coagulation profile  No results found for this basename: INR, PROTIME,  in the last 168  hours     Prior to Admission medications   Medication Sig Start Date End Date Taking? Authorizing Provider  amLODipine (NORVASC) 10 MG tablet Take 1 tablet (10 mg total) by mouth daily. 11/07/12   Richarda Overlie, MD  betamethasone dipropionate (DIPROLENE) 0.05 % cream Apply topically 2 (two) times daily. 11/07/12   Richarda Overlie, MD  citalopram (CELEXA) 20 MG tablet Take 1 tablet (20 mg total) by mouth daily. 02/16/13   Tereso Newcomer, MD  cloNIDine (CATAPRES - DOSED IN MG/24 HR) 0.1 mg/24hr patch Place 1 patch (0.1 mg total) onto the skin once a week. On Tuesdays 11/07/12   Richarda Overlie, MD  cloNIDine (CATAPRES) 0.1 MG tablet Take 0.1 mg by mouth 2 (two) times daily.    Historical Provider, MD  hydrocortisone 1 % ointment Apply topically 2 (two) times daily. 01/17/13   Dorothea Ogle, MD  ibuprofen (ADVIL,MOTRIN) 200 MG tablet Take 200 mg by mouth every 6 (six) hours as needed. For pain    Historical Provider, MD  metoprolol succinate (TOPROL-XL) 25 MG 24 hr tablet Take 2 tablets (50 mg total) by mouth daily. 01/05/13   Shanker Levora Dredge, MD  predniSONE (DELTASONE) 20 MG tablet Take 20 mg by mouth 3 (three) times daily.    Historical Provider, MD  telmisartan-hydrochlorothiazide (MICARDIS HCT) 40-12.5 MG per tablet Take 1 tablet by mouth daily. 11/07/12   Richarda Overlie, MD  vitamin C (ASCORBIC ACID) 500 MG tablet Take 500 mg by mouth daily.    Historical Provider, MD     Assessment & Plan   Right eye retinal detachment. Also has problems with her left eye and has been placed on prednisone 60 mg a day by her ophthalmologist, patient now has insurance problems and her previous ophthalmologist will not see her, have made urgent referral to a new ophthalmologist. Should also will call her previous ophthalmologist to see if the prednisone dose can be tapered or she needs to continue on the same dose.   Hypertension stable continue present medications as above       Leroy Sea M.D on 02/20/2013 at  12:05 PM

## 2013-02-28 ENCOUNTER — Ambulatory Visit (HOSPITAL_COMMUNITY)
Admission: RE | Admit: 2013-02-28 | Discharge: 2013-02-28 | Disposition: A | Discharge status: Home / Self Care | Payer: No Typology Code available for payment source | Source: Ambulatory Visit | Attending: Obstetrics & Gynecology | Admitting: Obstetrics & Gynecology

## 2013-02-28 DIAGNOSIS — Z1231 Encounter for screening mammogram for malignant neoplasm of breast: Secondary | ICD-10-CM | POA: Insufficient documentation

## 2013-04-04 ENCOUNTER — Ambulatory Visit: Payer: No Typology Code available for payment source | Attending: Family Medicine | Admitting: Internal Medicine

## 2013-04-04 VITALS — BP 156/118 | HR 65 | Temp 98.0°F | Resp 18 | Wt 173.0 lb

## 2013-04-04 DIAGNOSIS — B356 Tinea cruris: Secondary | ICD-10-CM

## 2013-04-04 DIAGNOSIS — R21 Rash and other nonspecific skin eruption: Secondary | ICD-10-CM | POA: Insufficient documentation

## 2013-04-04 DIAGNOSIS — I10 Essential (primary) hypertension: Secondary | ICD-10-CM

## 2013-04-04 MED ORDER — METOPROLOL SUCCINATE ER 25 MG PO TB24: 100.0000 mg | ORAL_TABLET | Freq: Every day | ORAL | Status: DC

## 2013-04-04 MED ORDER — TELMISARTAN-HCTZ 40-12.5 MG PO TABS: 2.0000 | ORAL_TABLET | Freq: Every day | ORAL | Status: DC

## 2013-04-04 MED ORDER — CLOTRIMAZOLE 1 % EX CREA: TOPICAL_CREAM | Freq: Two times a day (BID) | CUTANEOUS | Status: DC

## 2013-04-04 MED ORDER — CLOTRIMAZOLE-BETAMETHASONE 1-0.05 % EX CREA: TOPICAL_CREAM | Freq: Two times a day (BID) | CUTANEOUS | Status: DC

## 2013-04-04 NOTE — Progress Notes (Signed)
Patient has rash in vaginal area Not sure if it is a yeast infection

## 2013-04-04 NOTE — Progress Notes (Signed)
Patient ID: Kylie Harris, female   DOB: 1955/12/04, 57 y.o.   MRN: [id number]     ANYRA KAUFMAN WUJ:[id number] DOB: 12/27/1955 DOA: (Not on file)  PCP: Standley Dakins, MD   Chief Complaint: rash over legs and crural folds  HPI:  57 y/o female with uncontrolled HTN and retinal detachment here for rash over her foot since ? 1 month and recently over her crural area with itching. Reports rash to have worsened and very itchy. Now has itching with small papules over her groin for about 2 weeks. Denies fever, sick contacts , allergy to any meds, new linens or pets in house. . Denies similar symptoms in past. She has been on prednisone recently for her retinal detachment /?? Glaucoma and is on a tapered dose now after seeing opthalmologist recently. She reports being compliant with her BP meds but they are elevated today. Reports taking her morning meds.    Review of Systems:  As outlined in HPI   Past Medical History  Diagnosis Date  . Hypertension     takes Metoprolol,Micardis,Amlodipine,and CLonidine daily  . Cataract     left eye  . Glaucoma   . Detached retina     right   Past Surgical History  Procedure Laterality Date  . Total abdominal hysterectomy  1990    Done for fibroids and bleeding  . Scleral buckle with cryo  07/08/2012    Procedure: SCLERAL BUCKLE WITH CRYO;  Surgeon: Edmon Crape, MD;  Location: [facility] OR;  Service: Ophthalmology;  Laterality: Right;  CRYOPEXY  . Eye surgery      cataract removed from right  . Pars plana vitrectomy  09/15/2012    Procedure: PARS PLANA VITRECTOMY WITH 25 GAUGE;  Surgeon: Edmon Crape, MD;  Location: [facility] OR;  Service: Ophthalmology;  Laterality: Right;  SILICONE OIL  . Membrane peel  09/15/2012    Procedure: MEMBRANE PEEL;  Surgeon: Edmon Crape, MD;  Location: [facility] OR;  Service: Ophthalmology;  Laterality: Right;  . Photocoagulation with laser  09/15/2012    Procedure: PHOTOCOAGULATION WITH LASER;  Surgeon: Edmon Crape, MD;  Location: [facility]  OR;  Service: Ophthalmology;  Laterality: Right;  ENDOLASER  . Perfluorone injection  09/15/2012    Procedure: PERFLUORONE INJECTION;  Surgeon: Edmon Crape, MD;  Location: [facility] OR;  Service: Ophthalmology;  Laterality: Right;   Social History:  reports that she has never smoked. She does not have any smokeless tobacco history on file. She reports that she drinks about 1.2 ounces of alcohol per week. She reports that she does not use illicit drugs.  No Known Allergies  Family History  Problem Relation Age of Onset  . Hypertension Mother   . Heart failure Mother     Prior to Admission medications   Medication Sig Start Date End Date Taking? Authorizing Provider  amLODipine (NORVASC) 10 MG tablet Take 1 tablet (10 mg total) by mouth daily. 02/20/13   Leroy Sea, MD  betamethasone dipropionate (DIPROLENE) 0.05 % cream Apply topically 2 (two) times daily. 11/07/12   Richarda Overlie, MD  citalopram (CELEXA) 20 MG tablet Take 1 tablet (20 mg total) by mouth daily. 02/16/13   Tereso Newcomer, MD  cloNIDine (CATAPRES) 0.1 MG tablet Take 1 tablet (0.1 mg total) by mouth 2 (two) times daily. 02/20/13   Leroy Sea, MD  Clotrimazole 1% cream Apply topically 2 (two) times daily. 04/04/13   Eddie North, MD  ibuprofen (ADVIL,MOTRIN) 200 MG tablet Take 200 mg by mouth every 6 (six) hours as needed. For pain    Historical Provider, MD  metoprolol succinate (TOPROL-XL) 25 MG 24 hr tablet Take 4 tablets (100 mg total) by mouth daily. 04/04/13   Novalie Leamy, MD  predniSONE (DELTASONE) 20 MG tablet Take 20 mg by mouth 3 (three) times daily.    Historical Provider, MD  telmisartan-hydrochlorothiazide (MICARDIS HCT) 40-12.5 MG per tablet Take 2 tablets by mouth daily. 04/04/13   Yuto Cajuste, MD  vitamin C (ASCORBIC ACID) 500 MG tablet Take 500 mg by mouth daily.    Historical Provider, MD    Physical Exam:  Filed Vitals:   04/04/13 0938  BP: 156/118  Pulse: 65  Temp: 98 F (36.7 C)   Resp: 18  Weight: 173 lb (78.472 kg)  SpO2: 100%    Constitutional: Vital signs reviewed.  Patient is a well-developed and well-nourished in no acute distress and cooperative with exam. Alert and oriented x3.  HEENT: no pallor, moist mucosa Cardiovascular: RRR, S1 normal, S2 normal, no MRG, pulses symmetric and intact bilaterally Pulmonary/Chest: CTAB, no wheezes, rales, or rhonchi Abdominal: Soft. Non-tender, non-distended, bowel sounds are normal, no masses, organomegaly, or guarding present.  Whitish papules over groin b/l, no discharge or tenderness, vulvovaginal area not involved.  Ext: rash with well defined reddish margin involving lower legs b/l, no swelling or discharge,      Assessment/Plan Tenia infection of leg and groin  will provide her with clotrimazole ointment 1 % to apply bid ,. Refer to dermatology for evaluation and likely need for systemic antifungal therapy and monitoring Discontinue hydrocortisone topical ointment..   uncontrolled HTN  patient reports compliance with meds . BP quite elevated. Will increase dose of toprol to 100 mg daily and HXTZ -olmesartan to 25-50 mg daily.  Follow up in 2 weeks for BP monitoring.   Health maintenence Addressed  on last visit  Jaisha Villacres    04/04/2013, 10:21 AM

## 2013-04-04 NOTE — Patient Instructions (Signed)
Fungus Infection of the Skin  An infection of your skin caused by a fungus is a very common problem. Treatment depends on which part of the body is affected. Types of fungal skin infection include:  · Athlete's Foot(Tinea pedis). This infection starts between the toes and may involve the entire sole and sides of foot. It is the most common fungal disease. It is made worse by heat, moisture, and friction. To treat, wash your feet 2 to 3 times daily. Dry thoroughly between the toes. Use medicated foot powder or cream as directed on the package. Plain talc, cornstarch, or rice powder may be dusted into socks and shoes to keep the feet dry. Wearing footwear that allows ventilation is also helpful.  · Ringworm (Tinea corporis and tinea capitis). This infection causes scaly red rings to form on the skin or scalp. For skin sores, apply medicated lotion or cream as directed on the package. For the scalp, medicated shampoo may be used with with other therapies. Ringworm of the scalp or fingernails usually requires using oral medicine for 2 to 4 months.  · Tinea versicolor. This infection appears as painless, scaly, patchy areas of discolored skin (whitish to light brown). It is more common in the summer and favors oily areas of the skin such as those found at the chest, abdomen, back, pubis, neck, and body folds. It can be treated with medicated shampoo or with medicated topical cream. Oral antifungals may be needed for more active infections. The light and/or dark spots may take time to get better and is not a sign of treatment failure.  Fungal infections may need to be treated for several weeks to be cured. It is important not to treat fungal infections with steroids or combination medicine that contains an antifungal and steroid as these will make the fungal infection worse.  SEEK MEDICAL CARE IF:   · You have persistent itching or rawness.  · You have an oral temperature above 102° F (38.9° C).  Document Released:  10/01/2004 Document Revised: 11/16/2011 Document Reviewed: 12/17/2009  Exit[employer]® Patient Information ©2013 [employer].

## 2013-04-18 ENCOUNTER — Ambulatory Visit: Payer: No Typology Code available for payment source

## 2013-05-18 ENCOUNTER — Encounter (HOSPITAL_COMMUNITY): Payer: Self-pay | Admitting: Emergency Medicine

## 2013-05-18 ENCOUNTER — Emergency Department (INDEPENDENT_AMBULATORY_CARE_PROVIDER_SITE_OTHER)
Admission: EM | Admit: 2013-05-18 | Discharge: 2013-05-18 | Disposition: A | Discharge status: Home / Self Care | Payer: Self-pay | Source: Home / Self Care | Attending: Family Medicine | Admitting: Family Medicine

## 2013-05-18 DIAGNOSIS — B372 Candidiasis of skin and nail: Secondary | ICD-10-CM

## 2013-05-18 MED ORDER — FLUCONAZOLE 200 MG PO TABS: 200.0000 mg | ORAL_TABLET | Freq: Every day | ORAL | Status: AC

## 2013-05-18 MED ORDER — CLOTRIMAZOLE 1 % EX CREA: TOPICAL_CREAM | CUTANEOUS | Status: DC

## 2013-05-18 NOTE — ED Provider Notes (Signed)
CSN: [id number]     Arrival date & time 05/18/13  1629 History   First MD Initiated Contact with Patient 05/18/13 1729     Chief Complaint  Patient presents with  . Rash   (Consider location/radiation/quality/duration/timing/severity/associated sxs/prior Treatment) Patient is a 57 y.o. female presenting with rash. The history is provided by the patient.  Rash Pain severity:  No pain Duration:  4 days Progression:  Worsening Chronicity:  New Associated symptoms: dysuria   Associated symptoms: no chills and no fever     Past Medical History  Diagnosis Date  . Hypertension     takes Metoprolol,Micardis,Amlodipine,and CLonidine daily  . Cataract     left eye  . Glaucoma   . Detached retina     right   Past Surgical History  Procedure Laterality Date  . Total abdominal hysterectomy  1990    Done for fibroids and bleeding  . Scleral buckle with cryo  07/08/2012    Procedure: SCLERAL BUCKLE WITH CRYO;  Surgeon: Edmon Crape, MD;  Location: [facility] OR;  Service: Ophthalmology;  Laterality: Right;  CRYOPEXY  . Eye surgery      cataract removed from right  . Pars plana vitrectomy  09/15/2012    Procedure: PARS PLANA VITRECTOMY WITH 25 GAUGE;  Surgeon: Edmon Crape, MD;  Location: [facility] OR;  Service: Ophthalmology;  Laterality: Right;  SILICONE OIL  . Membrane peel  09/15/2012    Procedure: MEMBRANE PEEL;  Surgeon: Edmon Crape, MD;  Location: [facility] OR;  Service: Ophthalmology;  Laterality: Right;  . Photocoagulation with laser  09/15/2012    Procedure: PHOTOCOAGULATION WITH LASER;  Surgeon: Edmon Crape, MD;  Location: [facility] OR;  Service: Ophthalmology;  Laterality: Right;  ENDOLASER  . Perfluorone injection  09/15/2012    Procedure: PERFLUORONE INJECTION;  Surgeon: Edmon Crape, MD;  Location: [facility] OR;  Service: Ophthalmology;  Laterality: Right;   Family History  Problem Relation Age of Onset  . Hypertension Mother   . Heart failure Mother    History  Substance Use Topics  . Smoking status:  Never Smoker   . Smokeless tobacco: Not on file  . Alcohol Use: 1.2 oz/week    2 Glasses of wine per week     Comment: states, "every now and then"   OB History   Grav Para Term Preterm Abortions TAB SAB Ect Mult Living   2    2  2    0     Review of Systems  Constitutional: Negative for fever and chills.  Gastrointestinal: Negative.   Genitourinary: Positive for dysuria, urgency and frequency.  Skin: Positive for rash.    Allergies  Review of patient's allergies indicates no known allergies.  Home Medications   Current Outpatient Rx  Name  Route  Sig  Dispense  Refill  . amLODipine (NORVASC) 10 MG tablet   Oral   Take 1 tablet (10 mg total) by mouth daily.   30 tablet   2   . betamethasone dipropionate (DIPROLENE) 0.05 % cream   Topical   Apply topically 2 (two) times daily.   30 g   2   . citalopram (CELEXA) 20 MG tablet   Oral   Take 1 tablet (20 mg total) by mouth daily.   30 tablet   2   . cloNIDine (CATAPRES) 0.1 MG tablet   Oral   Take 1 tablet (0.1 mg total) by mouth 2 (two) times daily.   60 tablet   2   .  clotrimazole (LOTRIMIN) 1 % cream      Apply to affected area 2 times daily   60 g   0   . clotrimazole-betamethasone (LOTRISONE) cream   Topical   Apply topically 2 (two) times daily.   30 g   0   . fluconazole (DIFLUCAN) 200 MG tablet   Oral   Take 1 tablet (200 mg total) by mouth daily.   10 tablet   0   . hydrocortisone 1 % ointment   Topical   Apply topically 2 (two) times daily.   30 g   0   . ibuprofen (ADVIL,MOTRIN) 200 MG tablet   Oral   Take 200 mg by mouth every 6 (six) hours as needed. For pain         . metoprolol succinate (TOPROL-XL) 25 MG 24 hr tablet   Oral   Take 4 tablets (100 mg total) by mouth daily.   30 tablet   2   . predniSONE (DELTASONE) 20 MG tablet   Oral   Take 20 mg by mouth 3 (three) times daily.         Marland Kitchen telmisartan-hydrochlorothiazide (MICARDIS HCT) 40-12.5 MG per tablet   Oral    Take 2 tablets by mouth daily.   30 tablet   2   . vitamin C (ASCORBIC ACID) 500 MG tablet   Oral   Take 500 mg by mouth daily.          BP 151/72  Pulse 72  Temp(Src) 98.3 F (36.8 C) (Oral)  Resp 18  SpO2 100% Physical Exam  Nursing note and vitals reviewed. Constitutional: She is oriented to person, place, and time. She appears well-developed and well-nourished.  Abdominal: Soft. Bowel sounds are normal. There is no tenderness.  Neurological: She is alert and oriented to person, place, and time.  Skin: Skin is warm and dry. Rash noted.  Candida / yeast rash under breasts and intergluteal skin     ED Course  Procedures (including critical care time) Labs Review Labs Reviewed - No data to display Imaging Review No results found.  MDM  U/a neg.    Linna Hoff, MD 05/18/13 [id number]

## 2013-05-18 NOTE — ED Notes (Signed)
Pt c/o UTI sxs 2 weeks... sxs include: dysuria... Denies: fevers, abd/back pain Also c/o rash under both breasts 4 days Alert w/no signs of acute distress.

## 2013-05-19 LAB — POCT URINALYSIS DIP (DEVICE)
Bilirubin Urine: NEGATIVE
Ketones, ur: NEGATIVE mg/dL
Specific Gravity, Urine: <=1.005 (ref 1.005–1.030)
pH: 6 (ref 5.0–8.0)

## 2013-06-05 ENCOUNTER — Ambulatory Visit: Payer: No Typology Code available for payment source | Attending: Internal Medicine

## 2013-07-17 ENCOUNTER — Encounter: Payer: Self-pay | Admitting: Internal Medicine

## 2013-07-17 ENCOUNTER — Ambulatory Visit: Payer: No Typology Code available for payment source | Attending: Internal Medicine | Admitting: Internal Medicine

## 2013-07-17 VITALS — BP 142/93 | HR 74 | Temp 98.5°F | Resp 16 | Ht 62.0 in | Wt 162.0 lb

## 2013-07-17 DIAGNOSIS — I10 Essential (primary) hypertension: Secondary | ICD-10-CM | POA: Insufficient documentation

## 2013-07-17 LAB — POCT GLYCOSYLATED HEMOGLOBIN (HGB A1C): Hemoglobin A1C: 9

## 2013-07-17 MED ORDER — AMLODIPINE BESYLATE 10 MG PO TABS: 10.0000 mg | ORAL_TABLET | Freq: Every day | ORAL | Status: DC

## 2013-07-17 MED ORDER — METOPROLOL SUCCINATE ER 100 MG PO TB24: 100.0000 mg | ORAL_TABLET | Freq: Every day | ORAL | Status: DC

## 2013-07-17 MED ORDER — TELMISARTAN-HCTZ 40-12.5 MG PO TABS: 2.0000 | ORAL_TABLET | Freq: Every day | ORAL | Status: DC

## 2013-07-17 MED ORDER — CLONIDINE HCL 0.1 MG PO TABS: 0.1000 mg | ORAL_TABLET | Freq: Two times a day (BID) | ORAL | Status: DC

## 2013-07-17 NOTE — Progress Notes (Signed)
Patient here for follow  Up HTN

## 2013-07-17 NOTE — Patient Instructions (Signed)

## 2013-07-17 NOTE — Progress Notes (Signed)
Patient ID: Kylie Harris, female   DOB: [birth year], 56 y.o.   MRN: [id number] Patient Demographics  Kylie Harris, is a 57 y.o. female  [id number]  [id number]  DOB - 1956-02-28  Chief Complaint  Patient presents with  . Follow-up        Subjective:   Kylie Harris is a 57 y.o. female here today for a follow up visit. Patient has no new complaints. Needs a refill on all her medications, she claims compliant with medication but ran out a few days ago. Rash and itchy groin resolved completely. She does not smoke. Patient has No headache, No chest pain, No abdominal pain - No Nausea, No new weakness tingling or numbness, No Cough - SOB.  ALLERGIES: No Known Allergies  PAST MEDICAL HISTORY: Past Medical History  Diagnosis Date  . Hypertension     takes Metoprolol,Micardis,Amlodipine,and CLonidine daily  . Cataract     left eye  . Glaucoma   . Detached retina     right    MEDICATIONS AT HOME: Prior to Admission medications   Medication Sig Start Date End Date Taking? Authorizing Provider  amLODipine (NORVASC) 10 MG tablet Take 1 tablet (10 mg total) by mouth daily. 07/17/13  Yes Jeanann Lewandowsky, MD  betamethasone dipropionate (DIPROLENE) 0.05 % cream Apply topically 2 (two) times daily. 11/07/12  Yes Richarda Overlie, MD  cloNIDine (CATAPRES) 0.1 MG tablet Take 1 tablet (0.1 mg total) by mouth 2 (two) times daily. 07/17/13  Yes Jeanann Lewandowsky, MD  hydrocortisone 1 % ointment Apply topically 2 (two) times daily. 01/17/13  Yes Dorothea Ogle, MD  ibuprofen (ADVIL,MOTRIN) 200 MG tablet Take 200 mg by mouth every 6 (six) hours as needed. For pain   Yes Historical Provider, MD  metoprolol succinate (TOPROL-XL) 100 MG 24 hr tablet Take 1 tablet (100 mg total) by mouth daily. 07/17/13  Yes Jeanann Lewandowsky, MD  telmisartan-hydrochlorothiazide (MICARDIS HCT) 40-12.5 MG per tablet Take 2 tablets by mouth daily. 07/17/13  Yes Jeanann Lewandowsky, MD  vitamin C (ASCORBIC ACID) 500 MG  tablet Take 500 mg by mouth daily.   Yes Historical Provider, MD  citalopram (CELEXA) 20 MG tablet Take 1 tablet (20 mg total) by mouth daily. 02/16/13   Tereso Newcomer, MD  clotrimazole (LOTRIMIN) 1 % cream Apply to affected area 2 times daily 05/18/13   Linna Hoff, MD  clotrimazole-betamethasone (LOTRISONE) cream Apply topically 2 (two) times daily. 04/04/13   Nishant Dhungel, MD  predniSONE (DELTASONE) 20 MG tablet Take 20 mg by mouth 3 (three) times daily.    Historical Provider, MD     Objective:   Filed Vitals:   07/17/13 1400  BP: 142/93  Pulse: 74  Temp: 98.5 F (36.9 C)  Resp: 16  Height: 5\' 2"  (1.575 m)  Weight: 162 lb (73.483 kg)  SpO2: 100%    Exam General appearance : Awake, alert, not in any distress. Speech Clear. Not toxic looking HEENT: Atraumatic and Normocephalic, pupils equally reactive to light and accomodation Neck: supple, no JVD. No cervical lymphadenopathy.  Chest:Good air entry bilaterally, no added sounds  CVS: S1 S2 regular, no murmurs.  Abdomen: Bowel sounds present, Non tender and not distended with no gaurding, rigidity or rebound. Extremities: B/L Lower Ext shows no edema, both legs are warm to touch Neurology: Awake alert, and oriented X 3, CN II-XII intact, Non focal Skin:No Rash Wounds:N/A   Data Review   CBC No results found for this basename: WBC, HGB, HCT, PLT,  MCV, MCH, MCHC, RDW, NEUTRABS, LYMPHSABS, MONOABS, EOSABS, BASOSABS, BANDABS, BANDSABD,  in the last 168 hours  Chemistries   No results found for this basename: NA, K, CL, CO2, GLUCOSE, BUN, CREATININE, GFRCGP, CALCIUM, MG, AST, ALT, ALKPHOS, BILITOT,  in the last 168 hours ------------------------------------------------------------------------------------------------------------------ No results found for this basename: HGBA1C,  in the last 72 hours ------------------------------------------------------------------------------------------------------------------ No  results found for this basename: CHOL, HDL, LDLCALC, TRIG, CHOLHDL, LDLDIRECT,  in the last 72 hours ------------------------------------------------------------------------------------------------------------------ No results found for this basename: TSH, T4TOTAL, FREET3, T3FREE, THYROIDAB,  in the last 72 hours ------------------------------------------------------------------------------------------------------------------ No results found for this basename: VITAMINB12, FOLATE, FERRITIN, TIBC, IRON, RETICCTPCT,  in the last 72 hours  Coagulation profile  No results found for this basename: INR, PROTIME,  in the last 168 hours    Assessment & Plan   Patient Active Problem List   Diagnosis Date Noted  . Tinea cruris 04/04/2013  . 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 THYROID NODULE 03/11/2010  . ERUCTATION 01/27/2008  . HYPERTENSION 05/19/2007     Plan: Refill her medications  Amlodipine 10 mg tablet by mouth daily  Clonidine 0.1 mg tablet by mouth twice a day  Metoprolol ER 100 mg tablet by mouth every 24 hours  Telmisartan-hydrochlorothiazide 40-12.5 mg per tablet, 2 tablets by mouth daily  Patient has been counseled extensively about nutrition and exercise Resources for DASH diet given  Labs:  Comprehensive metabolic panel  TSH  Hemoglobin A1c  Lipid panel  Health Maintenance -Vaccinations:  -Influenza today  Follow up in 3 months or when necessary   The patient was given clear instructions to go to ER or return to medical center if symptoms don't improve, worsen or new problems develop. The patient verbalized understanding. The patient was told to call to get lab results if they haven't heard anything in the next week.    Jeanann Lewandowsky, MD, MHA, FACP, FAAP [facility] [place], [place] [phone]   07/17/2013, 2:22 PM

## 2013-07-18 LAB — CMP AND LIVER
ALT: 12 U/L (ref 0–35)
BUN: 11 mg/dL (ref 6–23)
Bilirubin, Direct: 0.1 mg/dL (ref 0.0–0.3)
CO2: 32 mEq/L (ref 19–32)
Calcium: 9.9 mg/dL (ref 8.4–10.5)
Chloride: 99 mEq/L (ref 96–112)
Creat: 0.56 mg/dL (ref 0.50–1.10)
Glucose, Bld: 115 mg/dL — ABNORMAL HIGH (ref 70–99)
Indirect Bilirubin: 0.2 mg/dL (ref 0.0–0.9)

## 2013-07-18 LAB — LIPID PANEL
HDL: 42 mg/dL (ref >39)
LDL Cholesterol: 98 mg/dL (ref 0–99)
Triglycerides: 126 mg/dL (ref <150)
VLDL: 25 mg/dL (ref 0–40)

## 2013-09-20 ENCOUNTER — Other Ambulatory Visit: Payer: Self-pay

## 2013-09-20 DIAGNOSIS — I10 Essential (primary) hypertension: Secondary | ICD-10-CM

## 2013-09-29 ENCOUNTER — Other Ambulatory Visit: Payer: Self-pay | Admitting: Emergency Medicine

## 2013-09-29 DIAGNOSIS — I10 Essential (primary) hypertension: Secondary | ICD-10-CM

## 2013-10-17 ENCOUNTER — Ambulatory Visit: Payer: Self-pay

## 2013-10-27 ENCOUNTER — Ambulatory Visit: Payer: No Typology Code available for payment source | Attending: Internal Medicine

## 2013-11-07 ENCOUNTER — Other Ambulatory Visit: Payer: Self-pay | Admitting: Emergency Medicine

## 2013-11-07 DIAGNOSIS — I10 Essential (primary) hypertension: Secondary | ICD-10-CM

## 2014-03-01 ENCOUNTER — Ambulatory Visit: Payer: No Typology Code available for payment source | Attending: Internal Medicine | Admitting: Internal Medicine

## 2014-03-01 ENCOUNTER — Other Ambulatory Visit: Payer: Self-pay | Admitting: Internal Medicine

## 2014-03-01 ENCOUNTER — Encounter: Payer: Self-pay | Admitting: Internal Medicine

## 2014-03-01 VITALS — BP 116/80 | HR 65 | Temp 98.0°F | Resp 16 | Ht 62.0 in | Wt 169.0 lb

## 2014-03-01 DIAGNOSIS — H548 Legal blindness, as defined in USA: Secondary | ICD-10-CM | POA: Insufficient documentation

## 2014-03-01 DIAGNOSIS — I10 Essential (primary) hypertension: Secondary | ICD-10-CM | POA: Insufficient documentation

## 2014-03-01 DIAGNOSIS — Z1211 Encounter for screening for malignant neoplasm of colon: Secondary | ICD-10-CM

## 2014-03-01 DIAGNOSIS — Z1239 Encounter for other screening for malignant neoplasm of breast: Secondary | ICD-10-CM | POA: Insufficient documentation

## 2014-03-01 DIAGNOSIS — B351 Tinea unguium: Secondary | ICD-10-CM | POA: Insufficient documentation

## 2014-03-01 DIAGNOSIS — H409 Unspecified glaucoma: Secondary | ICD-10-CM | POA: Insufficient documentation

## 2014-03-01 LAB — COMPLETE METABOLIC PANEL WITH GFR
ALT: 15 U/L (ref 0–35)
AST: 15 U/L (ref 0–37)
Albumin: 4.6 g/dL (ref 3.5–5.2)
Alkaline Phosphatase: 86 U/L (ref 39–117)
BILIRUBIN TOTAL: 0.6 mg/dL (ref 0.2–1.2)
BUN: 13 mg/dL (ref 6–23)
CALCIUM: 9.9 mg/dL (ref 8.4–10.5)
CO2: 28 meq/L (ref 19–32)
CREATININE: 0.62 mg/dL (ref 0.50–1.10)
Chloride: 100 mEq/L (ref 96–112)
GFR, Est Non African American: >89 mL/min
Glucose, Bld: 89 mg/dL (ref 70–99)
Potassium: 4.4 mEq/L (ref 3.5–5.3)
Sodium: 136 mEq/L (ref 135–145)
Total Protein: 8 g/dL (ref 6.0–8.3)

## 2014-03-01 LAB — CBC WITH DIFFERENTIAL/PLATELET
Basophils Absolute: 0 10*3/uL (ref 0.0–0.1)
Basophils Relative: 0 % (ref 0–1)
EOS ABS: 0 10*3/uL (ref 0.0–0.7)
EOS PCT: 0 % (ref 0–5)
HCT: 40.8 % (ref 36.0–46.0)
HEMOGLOBIN: 13.6 g/dL (ref 12.0–15.0)
LYMPHS ABS: 1.9 10*3/uL (ref 0.7–4.0)
Lymphocytes Relative: 24 % (ref 12–46)
MCH: 26.9 pg (ref 26.0–34.0)
MCHC: 33.3 g/dL (ref 30.0–36.0)
MCV: 80.6 fL (ref 78.0–100.0)
Monocytes Absolute: 0.4 10*3/uL (ref 0.1–1.0)
Monocytes Relative: 5 % (ref 3–12)
NEUTROS PCT: 71 % (ref 43–77)
Neutro Abs: 5.7 10*3/uL (ref 1.7–7.7)
Platelets: 407 10*3/uL — ABNORMAL HIGH (ref 150–400)
RBC: 5.06 MIL/uL (ref 3.87–5.11)
RDW: 15 % (ref 11.5–15.5)
WBC: 8 10*3/uL (ref 4.0–10.5)

## 2014-03-01 LAB — LIPID PANEL
CHOL/HDL RATIO: 3 ratio
CHOLESTEROL: 167 mg/dL (ref 0–200)
HDL: 55 mg/dL (ref >39)
LDL Cholesterol: 86 mg/dL (ref 0–99)
Triglycerides: 131 mg/dL (ref <150)
VLDL: 26 mg/dL (ref 0–40)

## 2014-03-01 LAB — POCT GLYCOSYLATED HEMOGLOBIN (HGB A1C): Hemoglobin A1C: 5.6

## 2014-03-01 MED ORDER — AMLODIPINE BESYLATE 10 MG PO TABS: 10.0000 mg | ORAL_TABLET | Freq: Every day | ORAL | Status: DC

## 2014-03-01 MED ORDER — TERBINAFINE HCL 250 MG PO TABS: 250.0000 mg | ORAL_TABLET | Freq: Every day | ORAL | Status: DC

## 2014-03-01 MED ORDER — TELMISARTAN-HCTZ 40-12.5 MG PO TABS: 2.0000 | ORAL_TABLET | Freq: Every day | ORAL | Status: DC

## 2014-03-01 MED ORDER — CLONIDINE HCL 0.1 MG PO TABS: 0.1000 mg | ORAL_TABLET | Freq: Two times a day (BID) | ORAL | Status: DC

## 2014-03-01 MED ORDER — METOPROLOL SUCCINATE ER 100 MG PO TB24: 100.0000 mg | ORAL_TABLET | Freq: Every day | ORAL | Status: DC

## 2014-03-01 NOTE — Progress Notes (Signed)
Patient ID: Kylie Harris, female   DOB: 01/23/1956, 58 y.o.   MRN: [id number]   Kylie Harris, is a 58 y.o. female  [id number]  [id number]  DOB - October 08, 1955  Chief Complaint  Patient presents with  . Follow-up        Subjective:   Kylie Harris is a 58 y.o. female here today for a follow up visit. Patient has hypertension uncontrolled, retinal detachment legally blind, recently seen for onychomycosis and treated, here today for routine follow-up. She needs refill her medications and she wants her infected finger nails to be treated. Patient has not been taking her medications lately because she ran out. Patient has No headache, No chest pain, No abdominal pain - No Nausea, No new weakness tingling or numbness, No Cough - SOB.  Problem  Onychomycosis  Legally Blind in Right Eye, As Defined in [place]  Uncontrolled Hypertension  Glaucoma  Breast Cancer Screening    ALLERGIES: No Known Allergies  PAST MEDICAL HISTORY: Past Medical History  Diagnosis Date  . Hypertension     takes Metoprolol,Micardis,Amlodipine,and CLonidine daily  . Cataract     left eye  . Glaucoma   . Detached retina     right    MEDICATIONS AT HOME: Prior to Admission medications   Medication Sig Start Date End Date Taking? Authorizing Provider  amLODipine (NORVASC) 10 MG tablet Take 1 tablet (10 mg total) by mouth daily. 03/01/14  Yes Angelica Chessman, MD  cloNIDine (CATAPRES) 0.1 MG tablet Take 1 tablet (0.1 mg total) by mouth 2 (two) times daily. 03/01/14  Yes Angelica Chessman, MD  ibuprofen (ADVIL,MOTRIN) 200 MG tablet Take 200 mg by mouth every 6 (six) hours as needed. For pain   Yes Historical Provider, MD  metoprolol succinate (TOPROL-XL) 100 MG 24 hr tablet Take 1 tablet (100 mg total) by mouth daily. 03/01/14  Yes Angelica Chessman, MD  vitamin C (ASCORBIC ACID) 500 MG tablet Take 500 mg by mouth daily.   Yes Historical Provider, MD  betamethasone dipropionate (DIPROLENE) 0.05 % cream  Apply topically 2 (two) times daily. 11/07/12   Reyne Dumas, MD  citalopram (CELEXA) 20 MG tablet Take 1 tablet (20 mg total) by mouth daily. 02/16/13   Osborne Oman, MD  clotrimazole (LOTRIMIN) 1 % cream Apply to affected area 2 times daily 05/18/13   Billy Fischer, MD  clotrimazole-betamethasone (LOTRISONE) cream Apply topically 2 (two) times daily. 04/04/13   Nishant Dhungel, MD  hydrocortisone 1 % ointment Apply topically 2 (two) times daily. 01/17/13   Theodis Blaze, MD  predniSONE (DELTASONE) 20 MG tablet Take 20 mg by mouth 3 (three) times daily.    Historical Provider, MD  telmisartan-hydrochlorothiazide (MICARDIS HCT) 40-12.5 MG per tablet Take 2 tablets by mouth daily. 03/01/14   Angelica Chessman, MD  terbinafine (LAMISIL) 250 MG tablet Take 1 tablet (250 mg total) by mouth daily. 03/01/14   Angelica Chessman, MD     Objective:   Filed Vitals:   03/01/14 1051  BP: 116/80  Pulse: 65  Temp: 98 F (36.7 C)  TempSrc: Oral  Resp: 16  Height: 5\' 2"  (1.575 m)  Weight: 169 lb (76.658 kg)  SpO2: 100%    Exam General appearance : Awake, alert, not in any distress. Speech Clear. Not toxic looking HEENT: Legally blind. Atraumatic and Normocephalic. Neck: supple, no JVD. No cervical lymphadenopathy.  Chest:Good air entry bilaterally, no added sounds  CVS: S1 S2 regular, no murmurs.  Abdomen: Bowel sounds present, Non  tender and not distended with no gaurding, rigidity or rebound. Extremities: B/L Lower Ext shows no edema, both legs are warm to touch Neurology: Awake alert, and oriented X 3, CN II-XII intact, Non focal Skin:No Rash Wounds:N/A  Data Review Lab Results  Component Value Date   HGBA1C 9.0 07/17/2013     Assessment & Plan   1. Onychomycosis  - terbinafine (LAMISIL) 250 MG tablet; Take 1 tablet (250 mg total) by mouth daily.  Dispense: 90 tablet; Refill: 1  2. Legally blind in right eye, as defined in [place]   3. Uncontrolled hypertension  - amLODipine  (NORVASC) 10 MG tablet; Take 1 tablet (10 mg total) by mouth daily.  Dispense: 90 tablet; Refill: 3 - cloNIDine (CATAPRES) 0.1 MG tablet; Take 1 tablet (0.1 mg total) by mouth 2 (two) times daily.  Dispense: 180 tablet; Refill: 3 - metoprolol succinate (TOPROL-XL) 100 MG 24 hr tablet; Take 1 tablet (100 mg total) by mouth daily.  Dispense: 90 tablet; Refill: 3 - telmisartan-hydrochlorothiazide (MICARDIS HCT) 40-12.5 MG per tablet; Take 2 tablets by mouth daily.  Dispense: 180 tablet; Refill: 3  - CBC with Differential - COMPLETE METABOLIC PANEL WITH GFR - Lipid panel - POCT glycosylated hemoglobin (Hb A1C) - TSH - Urinalysis, Complete  4. Glaucoma  - Ambulatory referral to Ophthalmology  5. Breast cancer screening  - MM Digital Screening; Future  6. Colon cancer screening  - HM COLONOSCOPY - Ambulatory referral to Gastroenterology  Return in about 6 months (around 08/31/2014), or if symptoms worsen or fail to improve, for Follow up HTN.  The patient was given clear instructions to go to ER or return to medical center if symptoms don't improve, worsen or new problems develop. The patient verbalized understanding. The patient was told to call to get lab results if they haven't heard anything in the next week.   This note has been created with Surveyor, quantity. Any transcriptional errors are unintentional.    Angelica Chessman, MD, [place], [place], [place] and [facility] [place], [place]   03/01/2014, 11:49 AM

## 2014-03-01 NOTE — Progress Notes (Signed)
Pt is here following up on her infected fingers and HTN.

## 2014-03-01 NOTE — Patient Instructions (Signed)
DASH Eating Plan DASH stands for "Dietary Approaches to Stop Hypertension." The DASH eating plan is a healthy eating plan that has been shown to reduce high blood pressure (hypertension). Additional health benefits may include reducing the risk of type 2 diabetes mellitus, heart disease, and stroke. The DASH eating plan may also help with weight loss. WHAT DO I NEED TO KNOW ABOUT THE DASH EATING PLAN? For the DASH eating plan, you will follow these general guidelines:  Choose foods with a percent daily value for sodium of less than 5% (as listed on the food label).  Use salt-free seasonings or herbs instead of table salt or sea salt.  Check with your health care provider or pharmacist before using salt substitutes.  Eat lower-sodium products, often labeled as "lower sodium" or "no salt added."  Eat fresh foods.  Eat more vegetables, fruits, and low-fat dairy products.  Choose whole grains. Look for the word "whole" as the first word in the ingredient list.  Choose fish and skinless chicken or turkey more often than red meat. Limit fish, poultry, and meat to 6 oz (170 g) each day.  Limit sweets, desserts, sugars, and sugary drinks.  Choose heart-healthy fats.  Limit cheese to 1 oz (28 g) per day.  Eat more home-cooked food and less restaurant, buffet, and fast food.  Limit fried foods.  Cook foods using methods other than frying.  Limit canned vegetables. If you do use them, rinse them well to decrease the sodium.  When eating at a restaurant, ask that your food be prepared with less salt, or no salt if possible. WHAT FOODS CAN I EAT? Seek help from a dietitian for individual calorie needs. Grains Whole grain or whole wheat bread. Brown rice. Whole grain or whole wheat pasta. Quinoa, bulgur, and whole grain cereals. Low-sodium cereals. Corn or whole wheat flour tortillas. Whole grain cornbread. Whole grain crackers. Low-sodium crackers. Vegetables Fresh or frozen vegetables  (raw, steamed, roasted, or grilled). Low-sodium or reduced-sodium tomato and vegetable juices. Low-sodium or reduced-sodium tomato sauce and paste. Low-sodium or reduced-sodium canned vegetables.  Fruits All fresh, canned (in natural juice), or frozen fruits. Meat and Other Protein Products Ground beef (85% or leaner), grass-fed beef, or beef trimmed of fat. Skinless chicken or turkey. Ground chicken or turkey. Pork trimmed of fat. All fish and seafood. Eggs. Dried beans, peas, or lentils. Unsalted nuts and seeds. Unsalted canned beans. Dairy Low-fat dairy products, such as skim or 1% milk, 2% or reduced-fat cheeses, low-fat ricotta or cottage cheese, or plain low-fat yogurt. Low-sodium or reduced-sodium cheeses. Fats and Oils Tub margarines without trans fats. Light or reduced-fat mayonnaise and salad dressings (reduced sodium). Avocado. Safflower, olive, or canola oils. Natural peanut or almond butter. Other Unsalted popcorn and pretzels. The items listed above may not be a complete list of recommended foods or beverages. Contact your dietitian for more options. WHAT FOODS ARE NOT RECOMMENDED? Grains White bread. White pasta. White rice. Refined cornbread. Bagels and croissants. Crackers that contain trans fat. Vegetables Creamed or fried vegetables. Vegetables in a cheese sauce. Regular canned vegetables. Regular canned tomato sauce and paste. Regular tomato and vegetable juices. Fruits Dried fruits. Canned fruit in light or heavy syrup. Fruit juice. Meat and Other Protein Products Fatty cuts of meat. Ribs, chicken wings, bacon, sausage, bologna, salami, chitterlings, fatback, hot dogs, bratwurst, and packaged luncheon meats. Salted nuts and seeds. Canned beans with salt. Dairy Whole or 2% milk, cream, half-and-half, and cream cheese. Whole-fat or sweetened yogurt. Full-fat   cheeses or blue cheese. Nondairy creamers and whipped toppings. Processed cheese, cheese spreads, or cheese  curds. Condiments Onion and garlic salt, seasoned salt, table salt, and sea salt. Canned and packaged gravies. Worcestershire sauce. Tartar sauce. Barbecue sauce. Teriyaki sauce. Soy sauce, including reduced sodium. Steak sauce. Fish sauce. Oyster sauce. Cocktail sauce. Horseradish. Ketchup and mustard. Meat flavorings and tenderizers. Bouillon cubes. Hot sauce. Tabasco sauce. Marinades. Taco seasonings. Relishes. Fats and Oils Butter, stick margarine, lard, shortening, ghee, and bacon fat. Coconut, palm kernel, or palm oils. Regular salad dressings. Other Pickles and olives. Salted popcorn and pretzels. The items listed above may not be a complete list of foods and beverages to avoid. Contact your dietitian for more information. WHERE CAN I FIND MORE INFORMATION? National Heart, Lung, and Blood Institute: travelstabloid.com Document Released: 08/13/2011 Document Revised: 08/29/2013 Document Reviewed: 06/28/2013 [facility] Patient Information 2015 [place], [place]. This information is not intended to replace advice given to you by your health care provider. Make sure you discuss any questions you have with your health care provider. Hypertension Hypertension, commonly called high blood pressure, is when the force of blood pumping through your arteries is too strong. Your arteries are the blood vessels that carry blood from your heart throughout your body. A blood pressure reading consists of a higher number over a lower number, such as 110/72. The higher number (systolic) is the pressure inside your arteries when your heart pumps. The lower number (diastolic) is the pressure inside your arteries when your heart relaxes. Ideally you want your blood pressure below 120/80. Hypertension forces your heart to work harder to pump blood. Your arteries may become narrow or stiff. Having hypertension puts you at risk for heart disease, stroke, and other problems.  RISK  FACTORS Some risk factors for high blood pressure are controllable. Others are not.  Risk factors you cannot control include:   Race. You may be at higher risk if you are African American.  Age. Risk increases with age.  Gender. Men are at higher risk than women before age 46 years. After age 78, women are at higher risk than men. Risk factors you can control include:  Not getting enough exercise or physical activity.  Being overweight.  Getting too much fat, sugar, calories, or salt in your diet.  Drinking too much alcohol. SIGNS AND SYMPTOMS Hypertension does not usually cause signs or symptoms. Extremely high blood pressure (hypertensive crisis) may cause headache, anxiety, shortness of breath, and nosebleed. DIAGNOSIS  To check if you have hypertension, your health care provider will measure your blood pressure while you are seated, with your arm held at the level of your heart. It should be measured at least twice using the same arm. Certain conditions can cause a difference in blood pressure between your right and left arms. A blood pressure reading that is higher than normal on one occasion does not mean that you need treatment. If one blood pressure reading is high, ask your health care provider about having it checked again. TREATMENT  Treating high blood pressure includes making lifestyle changes and possibly taking medication. Living a healthy lifestyle can help lower high blood pressure. You may need to change some of your habits. Lifestyle changes may include:  Following the DASH diet. This diet is high in fruits, vegetables, and whole grains. It is low in salt, red meat, and added sugars.  Getting at least 2 1/2 hours of brisk physical activity every week.  Losing weight if necessary.  Not smoking.  Limiting alcoholic beverages.  Learning ways to reduce stress. If lifestyle changes are not enough to get your blood pressure under control, your health care provider  may prescribe medicine. You may need to take more than one. Work closely with your health care provider to understand the risks and benefits. HOME CARE INSTRUCTIONS  Have your blood pressure rechecked as directed by your health care provider.   Only take medicine as directed by your health care provider. Follow the directions carefully. Blood pressure medicines must be taken as prescribed. The medicine does not work as well when you skip doses. Skipping doses also puts you at risk for problems.   Do not smoke.   Monitor your blood pressure at home as directed by your health care provider. SEEK MEDICAL CARE IF:   You think you are having a reaction to medicines taken.  You have recurrent headaches or feel dizzy.  You have swelling in your ankles.  You have trouble with your vision. SEEK IMMEDIATE MEDICAL CARE IF:  You develop a severe headache or confusion.  You have unusual weakness, numbness, or feel faint.  You have severe chest or abdominal pain.  You vomit repeatedly.  You have trouble breathing. MAKE SURE YOU:   Understand these instructions.  Will watch your condition.  Will get help right away if you are not doing well or get worse. Document Released: 08/24/2005 Document Revised: 08/29/2013 Document Reviewed: 06/16/2013 [facility] Patient Information 2015 [place], [place]. This information is not intended to replace advice given to you by your health care provider. Make sure you discuss any questions you have with your health care provider.

## 2014-03-02 LAB — URINALYSIS, COMPLETE
Bacteria, UA: NONE SEEN
Bilirubin Urine: NEGATIVE
Casts: NONE SEEN
Crystals: NONE SEEN
Glucose, UA: NEGATIVE mg/dL
Hgb urine dipstick: NEGATIVE
Ketones, ur: NEGATIVE mg/dL
LEUKOCYTES UA: NEGATIVE
Nitrite: NEGATIVE
PROTEIN: NEGATIVE mg/dL
SQUAMOUS EPITHELIAL / LPF: NONE SEEN
Specific Gravity, Urine: 1.019 (ref 1.005–1.030)
UROBILINOGEN UA: 0.2 mg/dL (ref 0.0–1.0)
pH: 7 (ref 5.0–8.0)

## 2014-03-02 LAB — TSH: TSH: 1.217 u[IU]/mL (ref 0.350–4.500)

## 2014-04-02 ENCOUNTER — Encounter: Payer: Self-pay | Admitting: Internal Medicine

## 2014-04-02 ENCOUNTER — Ambulatory Visit: Payer: No Typology Code available for payment source | Attending: Internal Medicine | Admitting: Internal Medicine

## 2014-04-02 VITALS — BP 108/71 | HR 51 | Temp 98.3°F | Resp 16 | Ht 62.0 in | Wt 178.0 lb

## 2014-04-02 DIAGNOSIS — IMO0002 Reserved for concepts with insufficient information to code with codable children: Secondary | ICD-10-CM | POA: Insufficient documentation

## 2014-04-02 DIAGNOSIS — I10 Essential (primary) hypertension: Secondary | ICD-10-CM | POA: Insufficient documentation

## 2014-04-02 DIAGNOSIS — H269 Unspecified cataract: Secondary | ICD-10-CM | POA: Insufficient documentation

## 2014-04-02 DIAGNOSIS — B351 Tinea unguium: Secondary | ICD-10-CM

## 2014-04-02 DIAGNOSIS — Z79899 Other long term (current) drug therapy: Secondary | ICD-10-CM | POA: Insufficient documentation

## 2014-04-02 MED ORDER — TERBINAFINE HCL 250 MG PO TABS: 250.0000 mg | ORAL_TABLET | Freq: Every day | ORAL | Status: DC

## 2014-04-02 NOTE — Progress Notes (Signed)
Pt is here following up on her infected finger nails. Pt states that she can see no progress in her fingers.

## 2014-04-02 NOTE — Patient Instructions (Signed)
Ringworm, Nail A fungal infection of the nail (tinea unguium/onychomycosis) is common. It is common as the visible part of the nail is composed of dead cells which have no blood supply to help prevent infection. It occurs because fungi are everywhere and will pick any opportunity to grow on any dead material. Because nails are very slow growing they require up to 2 years of treatment with anti-fungal medications. The entire nail back to the base is infected. This includes approximately  of the nail which you cannot see. If your caregiver has prescribed a medication by mouth, take it every day and as directed. No progress will be seen for at least 6 to 9 months. Do not be disappointed! Because fungi live on dead cells with little or no exposure to blood supply, medication delivery to the infection is slow; thus the cure is slow. It is also why you can observe no progress in the first 6 months. The nail becoming cured is the base of the nail, as it has the blood supply. Topical medication such as creams and ointments are usually not effective. Important in successful treatment of nail fungus is closely following the medication regimen that your doctor prescribes. Sometimes you and your caregiver may elect to speed up this process by surgical removal of all the nails. Even this may still require 6 to 9 months of additional oral medications. See your caregiver as directed. Remember there will be no visible improvement for at least 6 months. See your caregiver sooner if other signs of infection (redness and swelling) develop. Document Released: 08/21/2000 Document Revised: 11/16/2011 Document Reviewed: 10/30/2008 [employer] Patient Information 2015 [employer]. This information is not intended to replace advice given to you by your health care provider. Make sure you discuss any questions you have with your health care provider.  

## 2014-04-04 NOTE — Progress Notes (Signed)
Patient ID: Kylie Harris, female   DOB: July 26, 1956, 58 y.o.   MRN: [id number]   Kylie Harris, is a 58 y.o. female  [id number]  [id number]  DOB - April 01, 1956  Chief Complaint  Patient presents with  . Follow-up        Subjective:   Kylie Harris is a 58 y.o. female here today for a follow up visit. Patient was seen here last month with fungal infection of her nails in both hands, she was given medication for only one month but she remembers that she needs to take the medication for 3-6 months, she is here today for new prescription and to report that there has not been an improvement yet noticed. She has no other complaint. She is compliant with other medications for hypertension and her blood pressure is controlled. Patient has No headache, No chest pain, No abdominal pain - No Nausea, No new weakness tingling or numbness, No Cough - SOB.  No problems updated.  ALLERGIES: No Known Allergies  PAST MEDICAL HISTORY: Past Medical History  Diagnosis Date  . Hypertension     takes Metoprolol,Micardis,Amlodipine,and CLonidine daily  . Cataract     left eye  . Glaucoma   . Detached retina     right    MEDICATIONS AT HOME: Prior to Admission medications   Medication Sig Start Date End Date Taking? Authorizing Provider  amLODipine (NORVASC) 10 MG tablet Take 1 tablet (10 mg total) by mouth daily. 03/01/14  Yes Angelica Chessman, MD  cloNIDine (CATAPRES) 0.1 MG tablet Take 1 tablet (0.1 mg total) by mouth 2 (two) times daily. 03/01/14  Yes Angelica Chessman, MD  ibuprofen (ADVIL,MOTRIN) 200 MG tablet Take 200 mg by mouth every 6 (six) hours as needed. For pain   Yes Historical Provider, MD  metoprolol succinate (TOPROL-XL) 100 MG 24 hr tablet Take 1 tablet (100 mg total) by mouth daily. 03/01/14  Yes Angelica Chessman, MD  telmisartan-hydrochlorothiazide (MICARDIS HCT) 40-12.5 MG per tablet Take 2 tablets by mouth daily. 03/01/14  Yes Angelica Chessman, MD  terbinafine  (LAMISIL) 250 MG tablet Take 1 tablet (250 mg total) by mouth daily. 04/02/14  Yes Angelica Chessman, MD  vitamin C (ASCORBIC ACID) 500 MG tablet Take 500 mg by mouth daily.   Yes Historical Provider, MD  betamethasone dipropionate (DIPROLENE) 0.05 % cream Apply topically 2 (two) times daily. 11/07/12   Reyne Dumas, MD  citalopram (CELEXA) 20 MG tablet Take 1 tablet (20 mg total) by mouth daily. 02/16/13   Osborne Oman, MD  clotrimazole (LOTRIMIN) 1 % cream Apply to affected area 2 times daily 05/18/13   Billy Fischer, MD  clotrimazole-betamethasone (LOTRISONE) cream Apply topically 2 (two) times daily. 04/04/13   Nishant Dhungel, MD  hydrocortisone 1 % ointment Apply topically 2 (two) times daily. 01/17/13   Theodis Blaze, MD  predniSONE (DELTASONE) 20 MG tablet Take 20 mg by mouth 3 (three) times daily.    Historical Provider, MD     Objective:   Filed Vitals:   04/02/14 1709  BP: 108/71  Pulse: 51  Temp: 98.3 F (36.8 C)  TempSrc: Oral  Resp: 16  Height: 5\' 2"  (1.575 m)  Weight: 178 lb (80.74 kg)  SpO2: 98%    Exam General appearance : Awake, alert, not in any distress. Speech Clear. Not toxic looking HEENT: Atraumatic and Normocephalic, pupils equally reactive to light and accomodation Neck: supple, no JVD. No cervical lymphadenopathy.  Chest:Good air entry bilaterally, no added sounds  CVS: S1 S2 regular, no murmurs.  Abdomen: Bowel sounds present, Non tender and not distended with no gaurding, rigidity or rebound. Extremities: Onychomycosis  Neurology: Awake alert, and oriented X 3, CN II-XII intact, Non focal Skin:No Rash Wounds:N/A  Data Review Lab Results  Component Value Date   HGBA1C 5.6 03/01/2014   HGBA1C 9.0 07/17/2013     Assessment & Plan   1. Onychomycosis  - terbinafine (LAMISIL) 250 MG tablet; Take 1 tablet (250 mg total) by mouth daily.  Dispense: 90 tablet; Refill: 3 Liver function test to be done in 4 weeks  Patient was educated about her  diagnosis and the need to give enough time for medication to take effect with noticeable improvement due to the natural history of onychomycosis. Patient verbalized understanding  Return if symptoms worsen or fail to improve, for Follow up HTN.  The patient was given clear instructions to go to ER or return to medical center if symptoms don't improve, worsen or new problems develop. The patient verbalized understanding. The patient was told to call to get lab results if they haven't heard anything in the next week.   This note has been created with Surveyor, quantity. Any transcriptional errors are unintentional.    Angelica Chessman, MD, [place], [place], [place] and [place], [place]   04/04/2014, 5:17 PM

## 2014-05-01 ENCOUNTER — Ambulatory Visit: Payer: Self-pay | Attending: Internal Medicine

## 2014-05-10 IMAGING — CR DG CHEST 2V
2 series · 2 of 2 positions shown · non-contrast
Comparison: None.

CLINICAL DATA: 55-year-old female with left face and ear pain.
Possible infection.  Hypertension.

CHEST - 2 VIEW

[w chest pa]
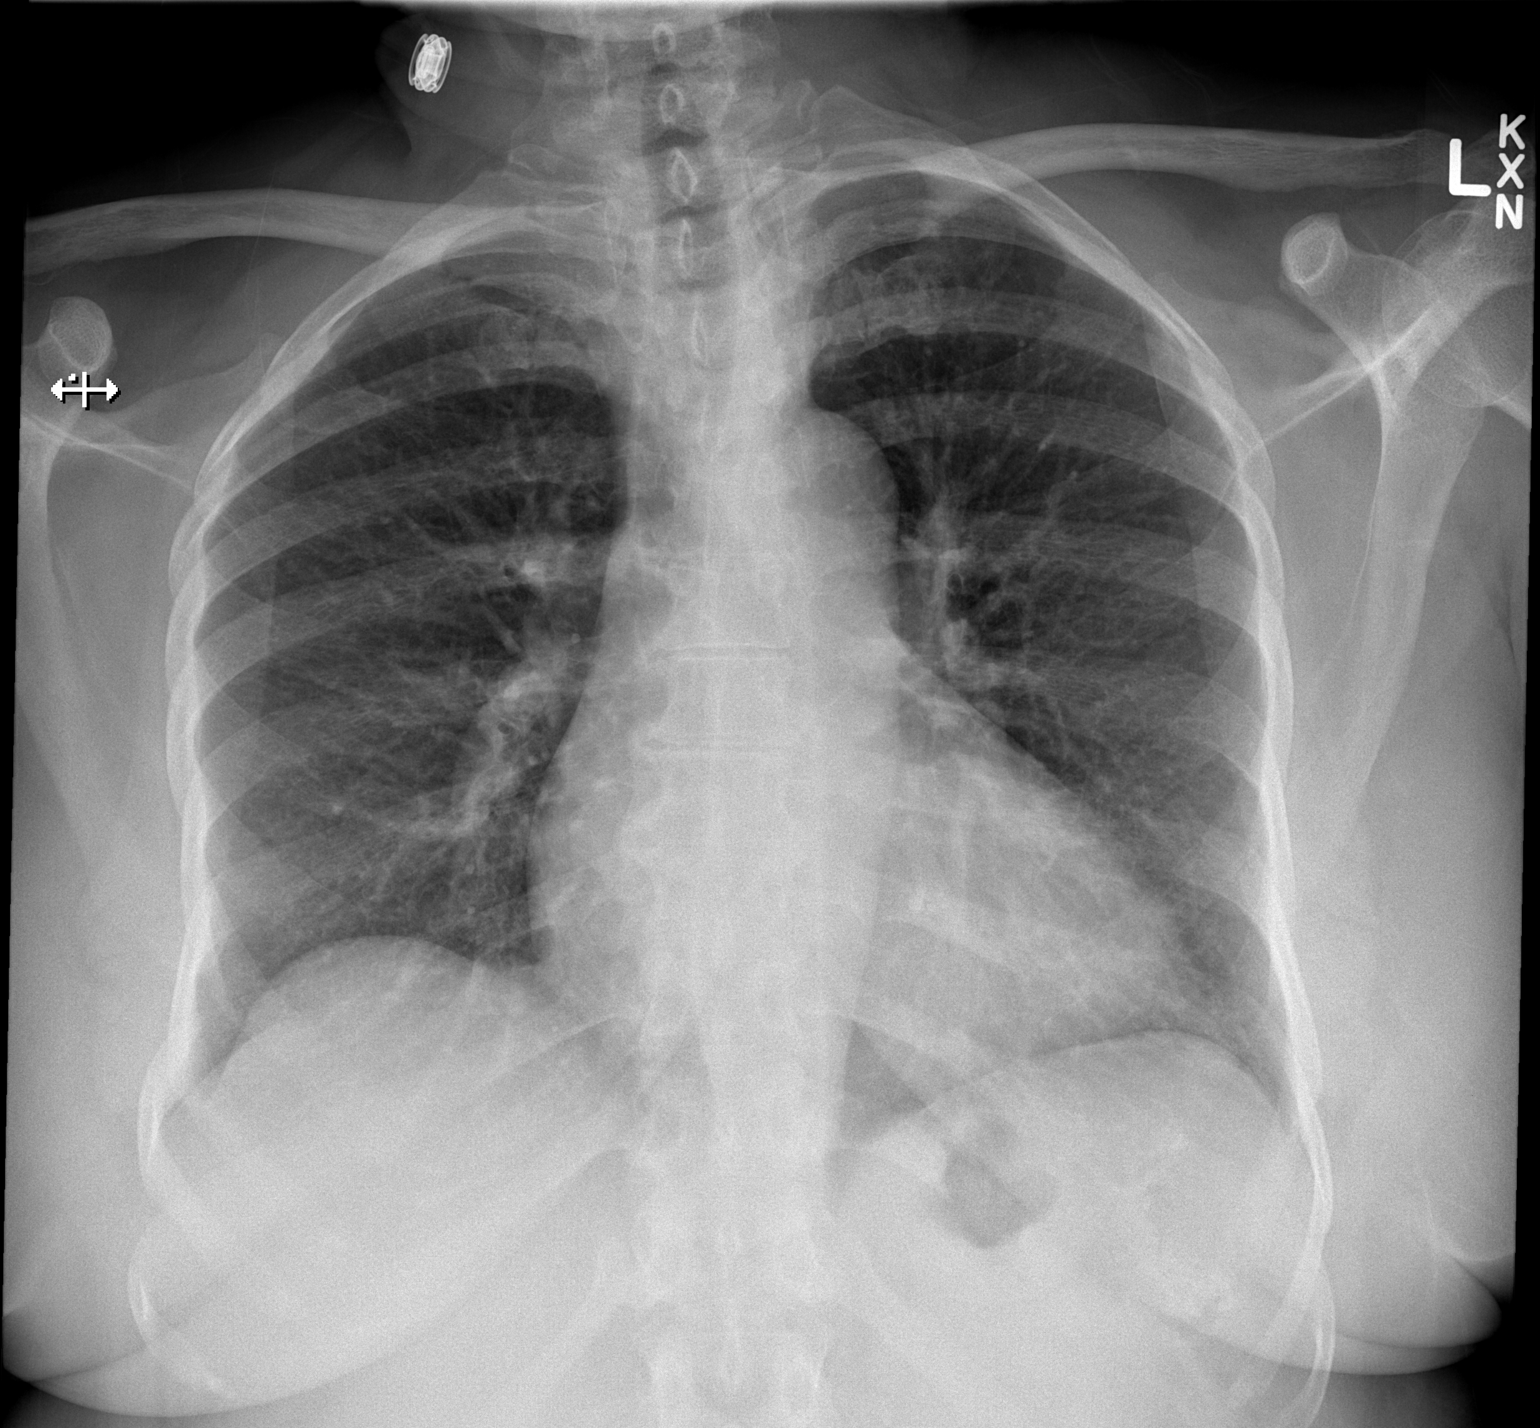

[w chest lat]
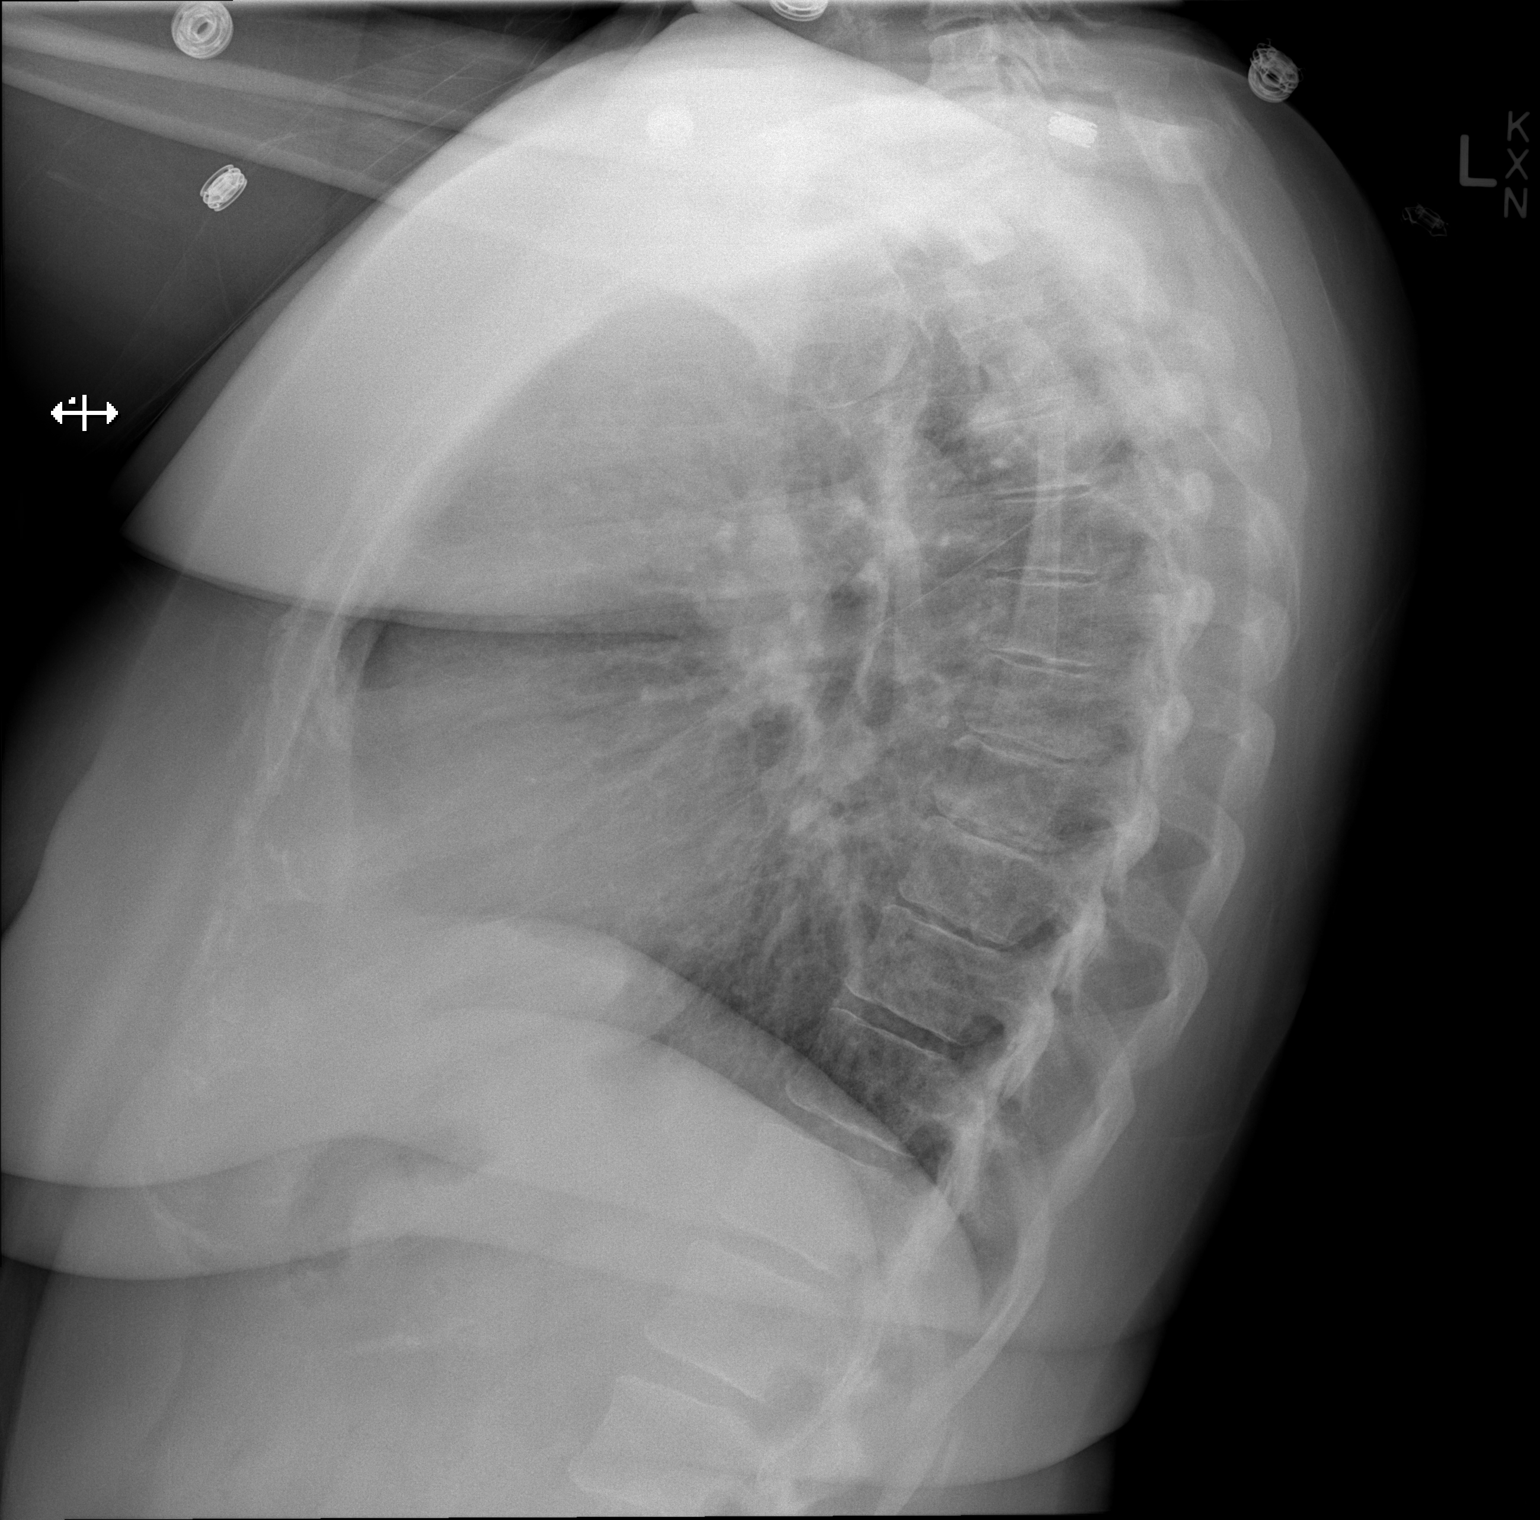

[2 of 2 positions shown; findings below may reference images not displayed]

FINDINGS: Mild cardiomegaly. Other mediastinal contours are within
normal limits.  Visualized tracheal air column is within normal
limits.  No pneumothorax, pleural effusion or confluent pulmonary
opacity.  Increased interstitial markings may be related to
crowding, pulmonary vascularity appears within normal limits. No
acute osseous abnormality identified.
IMPRESSION: No acute cardiopulmonary abnormality.

## 2014-07-09 ENCOUNTER — Encounter: Payer: Self-pay | Admitting: Internal Medicine

## 2014-08-28 ENCOUNTER — Encounter: Payer: Self-pay | Admitting: Internal Medicine

## 2014-08-28 ENCOUNTER — Ambulatory Visit: Payer: Self-pay | Attending: Internal Medicine | Admitting: Internal Medicine

## 2014-08-28 VITALS — BP 90/60 | HR 57 | Temp 98.2°F | Resp 16 | Ht 62.0 in | Wt 168.0 lb

## 2014-08-28 DIAGNOSIS — I10 Essential (primary) hypertension: Secondary | ICD-10-CM | POA: Insufficient documentation

## 2014-08-28 DIAGNOSIS — B351 Tinea unguium: Secondary | ICD-10-CM | POA: Insufficient documentation

## 2014-08-28 DIAGNOSIS — Z0000 Encounter for general adult medical examination without abnormal findings: Secondary | ICD-10-CM | POA: Insufficient documentation

## 2014-08-28 LAB — HEPATIC FUNCTION PANEL
ALT: 9 U/L (ref 0–35)
AST: 13 U/L (ref 0–37)
Albumin: 4 g/dL (ref 3.5–5.2)
Alkaline Phosphatase: 85 U/L (ref 39–117)
Bilirubin, Direct: 0.1 mg/dL (ref 0.0–0.3)
Indirect Bilirubin: 0.3 mg/dL (ref 0.2–1.2)
TOTAL PROTEIN: 7.3 g/dL (ref 6.0–8.3)
Total Bilirubin: 0.4 mg/dL (ref 0.2–1.2)

## 2014-08-28 MED ORDER — TERBINAFINE HCL 250 MG PO TABS: 250.0000 mg | ORAL_TABLET | Freq: Every day | ORAL | Status: DC

## 2014-08-28 NOTE — Progress Notes (Signed)
Patient ID: Kylie Harris, female   DOB: 1955/11/12, 58 y.o.   MRN: [id number]   Kylie Harris, is a 58 y.o. female  [id number]  [id number]  DOB - 08/13/1956  Chief Complaint  Patient presents with  . Follow-up        Subjective:   Kylie Harris is a 58 y.o. female here today for a follow up visit. Patient has history of hypertension, retinal detachment on the right came in today for routine follow-up. She complains of ongoing fungal infection on her hands ,  She said it is getting better with the current treatment of Lamisil. She is compliant with medications. She reports no side effects. She needs refills on her Fungal medications. She is due for mammogram and colonoscopy. Patient has No headache, No chest pain, No abdominal pain - No Nausea, No new weakness tingling or numbness, No Cough - SOB.  Problem  Essential Hypertension    ALLERGIES: No Known Allergies  PAST MEDICAL HISTORY: Past Medical History  Diagnosis Date  . Hypertension     takes Metoprolol,Micardis,Amlodipine,and CLonidine daily  . Cataract     left eye  . Glaucoma   . Detached retina     right    MEDICATIONS AT HOME: Prior to Admission medications   Medication Sig Start Date End Date Taking? Authorizing Provider  amLODipine (NORVASC) 10 MG tablet Take 1 tablet (10 mg total) by mouth daily. 03/01/14  Yes Tresa Garter, MD  citalopram (CELEXA) 20 MG tablet Take 1 tablet (20 mg total) by mouth daily. 02/16/13  Yes Osborne Oman, MD  cloNIDine (CATAPRES) 0.1 MG tablet Take 1 tablet (0.1 mg total) by mouth 2 (two) times daily. 03/01/14  Yes Tresa Garter, MD  ibuprofen (ADVIL,MOTRIN) 200 MG tablet Take 200 mg by mouth every 6 (six) hours as needed. For pain   Yes Historical Provider, MD  metoprolol succinate (TOPROL-XL) 100 MG 24 hr tablet Take 1 tablet (100 mg total) by mouth daily. 03/01/14  Yes Tresa Garter, MD  telmisartan-hydrochlorothiazide (MICARDIS HCT) 40-12.5 MG per  tablet Take 2 tablets by mouth daily. 03/01/14  Yes Tresa Garter, MD  terbinafine (LAMISIL) 250 MG tablet Take 1 tablet (250 mg total) by mouth daily. 08/28/14  Yes Tresa Garter, MD  vitamin C (ASCORBIC ACID) 500 MG tablet Take 500 mg by mouth daily.   Yes Historical Provider, MD  betamethasone dipropionate (DIPROLENE) 0.05 % cream Apply topically 2 (two) times daily. Patient not taking: Reported on 08/28/2014 11/07/12   Reyne Dumas, MD  clotrimazole (LOTRIMIN) 1 % cream Apply to affected area 2 times daily Patient not taking: Reported on 08/28/2014 05/18/13   Billy Fischer, MD  clotrimazole-betamethasone (LOTRISONE) cream Apply topically 2 (two) times daily. Patient not taking: Reported on 08/28/2014 04/04/13   Nishant Dhungel, MD  hydrocortisone 1 % ointment Apply topically 2 (two) times daily. Patient not taking: Reported on 08/28/2014 01/17/13   Theodis Blaze, MD  predniSONE (DELTASONE) 20 MG tablet Take 20 mg by mouth 3 (three) times daily.    Historical Provider, MD     Objective:   Filed Vitals:   08/28/14 0952  BP: 90/60  Pulse: 57  Temp: 98.2 F (36.8 C)  TempSrc: Oral  Resp: 16  Height: 5\' 2"  (1.575 m)  Weight: 168 lb (76.204 kg)  SpO2: 100%    Exam General appearance : Awake, alert, not in any distress. Speech Clear. Not toxic looking HEENT: Atraumatic and Normocephalic, pupils equally  reactive to light and accomodation Neck: supple, no JVD. No cervical lymphadenopathy.  Chest:Good air entry bilaterally, no added sounds  CVS: S1 S2 regular, no murmurs.  Abdomen: Bowel sounds present, Non tender and not distended with no gaurding, rigidity or rebound. Extremities: B/L Lower Ext shows no edema, both legs are warm to touch Neurology: Awake alert, and oriented X 3, CN II-XII intact, Non focal  Data Review Lab Results  Component Value Date   HGBA1C 5.6 03/01/2014   HGBA1C 9.0 07/17/2013     Assessment & Plan   1. Onychomycosis  - Hepatic Function  Panel - terbinafine (LAMISIL) 250 MG tablet; Take 1 tablet (250 mg total) by mouth daily.  Dispense: 90 tablet; Refill: 3  2. Essential hypertension : Controlled Continue Toprol XL , clonidine and Micardis DASH Diet  3. Preventative health care - MM Digital Screening; Future - HM COLONOSCOPY - Ambulatory referral to Gastroenterology for screening colonoscopy.  Return in about 6 months (around 02/27/2015) for Annual Physical, Follow up HTN.  The patient was given clear instructions to go to ER or return to medical center if symptoms don't improve, worsen or new problems develop. The patient verbalized understanding. The patient was told to call to get lab results if they haven't heard anything in the next week.   This note has been created with Surveyor, quantity. Any transcriptional errors are unintentional.    Angelica Chessman, MD, [place], [place], [place] and [facility] [place], [place]   08/28/2014, 10:34 AM

## 2014-08-28 NOTE — Progress Notes (Signed)
F/U HTN, taking BP medication daily as prescribe F/U Fungal infection on hands Pt stated getting better, still with soreness around nails, no itching

## 2014-08-28 NOTE — Patient Instructions (Signed)

## 2014-09-14 ENCOUNTER — Telehealth: Payer: Self-pay | Admitting: Emergency Medicine

## 2014-09-14 NOTE — Telephone Encounter (Signed)
Pt given normal lab results 

## 2014-09-14 NOTE — Telephone Encounter (Signed)
-----   Message from Tresa Garter, MD sent at 09/02/2014  2:46 PM EST ----- Please inform patient that her liver function test is normal.

## 2014-09-27 ENCOUNTER — Ambulatory Visit (HOSPITAL_COMMUNITY): Payer: Self-pay

## 2014-09-27 ENCOUNTER — Other Ambulatory Visit: Payer: Self-pay | Admitting: Internal Medicine

## 2014-09-27 ENCOUNTER — Ambulatory Visit (HOSPITAL_COMMUNITY)
Admission: RE | Admit: 2014-09-27 | Discharge: 2014-09-27 | Disposition: A | Discharge status: Home / Self Care | Payer: Self-pay | Source: Ambulatory Visit | Attending: Internal Medicine | Admitting: Internal Medicine

## 2014-09-27 DIAGNOSIS — Z0000 Encounter for general adult medical examination without abnormal findings: Secondary | ICD-10-CM

## 2014-09-27 DIAGNOSIS — Z1231 Encounter for screening mammogram for malignant neoplasm of breast: Secondary | ICD-10-CM

## 2014-10-19 ENCOUNTER — Other Ambulatory Visit: Payer: Self-pay | Admitting: Internal Medicine

## 2014-10-19 NOTE — Telephone Encounter (Signed)
Pt is calling to get a refill on: metoprolol succinate (TOPROL-XL) 100 MG 24 hr tablet

## 2014-10-19 NOTE — Telephone Encounter (Signed)
Rx has one more refill Pt aware,

## 2014-10-23 ENCOUNTER — Telehealth: Payer: Self-pay | Admitting: *Deleted

## 2014-10-23 NOTE — Telephone Encounter (Signed)
Left message to return my call.  

## 2014-10-23 NOTE — Telephone Encounter (Signed)
-----   Message from Tresa Garter, MD sent at 10/03/2014  3:43 PM EST ----- Please inform patient that her screening mammogram is negative for malignancy. Recommend screening mammogram in one year.

## 2014-10-26 ENCOUNTER — Other Ambulatory Visit: Payer: Self-pay | Admitting: Emergency Medicine

## 2014-10-26 DIAGNOSIS — I10 Essential (primary) hypertension: Secondary | ICD-10-CM

## 2014-11-02 ENCOUNTER — Ambulatory Visit: Payer: Self-pay | Attending: Internal Medicine

## 2014-11-06 ENCOUNTER — Encounter: Payer: Self-pay | Admitting: Internal Medicine

## 2014-11-06 ENCOUNTER — Ambulatory Visit: Payer: Self-pay | Attending: Internal Medicine | Admitting: Internal Medicine

## 2014-11-06 VITALS — BP 130/74 | HR 53 | Temp 97.4°F | Resp 18

## 2014-11-06 DIAGNOSIS — H8113 Benign paroxysmal vertigo, bilateral: Secondary | ICD-10-CM

## 2014-11-06 DIAGNOSIS — I10 Essential (primary) hypertension: Secondary | ICD-10-CM

## 2014-11-06 MED ORDER — MECLIZINE HCL 25 MG PO TABS: 25.0000 mg | ORAL_TABLET | Freq: Three times a day (TID) | ORAL | Status: DC | PRN

## 2014-11-06 MED ORDER — AMLODIPINE BESYLATE 10 MG PO TABS: 10.0000 mg | ORAL_TABLET | Freq: Every day | ORAL | Status: DC

## 2014-11-06 MED ORDER — CLONIDINE HCL 0.1 MG PO TABS: 0.1000 mg | ORAL_TABLET | Freq: Two times a day (BID) | ORAL | Status: DC

## 2014-11-06 MED ORDER — LOSARTAN POTASSIUM-HCTZ 50-12.5 MG PO TABS: 1.0000 | ORAL_TABLET | Freq: Every day | ORAL | Status: DC

## 2014-11-06 NOTE — Patient Instructions (Signed)
Benign Positional Vertigo Vertigo means you feel like you or your surroundings are moving when they are not. Benign positional vertigo is the most common form of vertigo. Benign means that the cause of your condition is not serious. Benign positional vertigo is more common in older adults. CAUSES  Benign positional vertigo is the result of an upset in the labyrinth system. This is an area in the middle ear that helps control your balance. This may be caused by a viral infection, head injury, or repetitive motion. However, often no specific cause is found. SYMPTOMS  Symptoms of benign positional vertigo occur when you move your head or eyes in different directions. Some of the symptoms may include:  Loss of balance and falls.  Vomiting.  Blurred vision.  Dizziness.  Nausea.  Involuntary eye movements (nystagmus). DIAGNOSIS  Benign positional vertigo is usually diagnosed by physical exam. If the specific cause of your benign positional vertigo is unknown, your caregiver may perform imaging tests, such as magnetic resonance imaging (MRI) or computed tomography (CT). TREATMENT  Your caregiver may recommend movements or procedures to correct the benign positional vertigo. Medicines such as meclizine, benzodiazepines, and medicines for nausea may be used to treat your symptoms. In rare cases, if your symptoms are caused by certain conditions that affect the inner ear, you may need surgery. HOME CARE INSTRUCTIONS   Follow your caregiver's instructions.  Move slowly. Do not make sudden body or head movements.  Avoid driving.  Avoid operating heavy machinery.  Avoid performing any tasks that would be dangerous to you or others during a vertigo episode.  Drink enough fluids to keep your urine clear or pale yellow. SEEK IMMEDIATE MEDICAL CARE IF:   You develop problems with walking, weakness, numbness, or using your arms, hands, or legs.  You have difficulty speaking.  You develop  severe headaches.  Your nausea or vomiting continues or gets worse.  You develop visual changes.  Your family or friends notice any behavioral changes.  Your condition gets worse.  You have a fever.  You develop a stiff neck or sensitivity to light. MAKE SURE YOU:   Understand these instructions.  Will watch your condition.  Will get help right away if you are not doing well or get worse. Document Released: 06/01/2006 Document Revised: 11/16/2011 Document Reviewed: 05/14/2011 [facility] Patient Information 2015 [place], [place]. This information is not intended to replace advice given to you by your health care provider. Make sure you discuss any questions you have with your health care provider. DASH Eating Plan DASH stands for "Dietary Approaches to Stop Hypertension." The DASH eating plan is a healthy eating plan that has been shown to reduce high blood pressure (hypertension). Additional health benefits may include reducing the risk of type 2 diabetes mellitus, heart disease, and stroke. The DASH eating plan may also help with weight loss. WHAT DO I NEED TO KNOW ABOUT THE DASH EATING PLAN? For the DASH eating plan, you will follow these general guidelines:  Choose foods with a percent daily value for sodium of less than 5% (as listed on the food label).  Use salt-free seasonings or herbs instead of table salt or sea salt.  Check with your health care provider or pharmacist before using salt substitutes.  Eat lower-sodium products, often labeled as "lower sodium" or "no salt added."  Eat fresh foods.  Eat more vegetables, fruits, and low-fat dairy products.  Choose whole grains. Look for the word "whole" as the first word in the ingredient  list.  Choose fish and skinless chicken or [place] more often than red meat. Limit fish, poultry, and meat to 6 oz (170 g) each day.  Limit sweets, desserts, sugars, and sugary drinks.  Choose heart-healthy fats.  Limit cheese to 1 oz  (28 g) per day.  Eat more home-cooked food and less restaurant, buffet, and fast food.  Limit fried foods.  Cook foods using methods other than frying.  Limit canned vegetables. If you do use them, rinse them well to decrease the sodium.  When eating at a restaurant, ask that your food be prepared with less salt, or no salt if possible. WHAT FOODS CAN I EAT? Seek help from a dietitian for individual calorie needs. Grains Whole grain or whole wheat bread. Brown rice. Whole grain or whole wheat pasta. Quinoa, bulgur, and whole grain cereals. Low-sodium cereals. Corn or whole wheat flour tortillas. Whole grain cornbread. Whole grain crackers. Low-sodium crackers. Vegetables Fresh or frozen vegetables (raw, steamed, roasted, or grilled). Low-sodium or reduced-sodium tomato and vegetable juices. Low-sodium or reduced-sodium tomato sauce and paste. Low-sodium or reduced-sodium canned vegetables.  Fruits All fresh, canned (in natural juice), or frozen fruits. Meat and Other Protein Products Ground beef (85% or leaner), grass-fed beef, or beef trimmed of fat. Skinless chicken or [place]. Ground chicken or [place]. Pork trimmed of fat. All fish and seafood. Eggs. Dried beans, peas, or lentils. Unsalted nuts and seeds. Unsalted canned beans. Dairy Low-fat dairy products, such as skim or 1% milk, 2% or reduced-fat cheeses, low-fat ricotta or cottage cheese, or plain low-fat yogurt. Low-sodium or reduced-sodium cheeses. Fats and Oils Tub margarines without trans fats. Light or reduced-fat mayonnaise and salad dressings (reduced sodium). Avocado. Safflower, olive, or canola oils. Natural peanut or almond butter. Other Unsalted popcorn and pretzels. The items listed above may not be a complete list of recommended foods or beverages. Contact your dietitian for more options. WHAT FOODS ARE NOT RECOMMENDED? Grains White bread. White pasta. White rice. Refined cornbread. Bagels and croissants. Crackers  that contain trans fat. Vegetables Creamed or fried vegetables. Vegetables in a cheese sauce. Regular canned vegetables. Regular canned tomato sauce and paste. Regular tomato and vegetable juices. Fruits Dried fruits. Canned fruit in light or heavy syrup. Fruit juice. Meat and Other Protein Products Fatty cuts of meat. Ribs, chicken wings, bacon, sausage, bologna, salami, chitterlings, fatback, hot dogs, bratwurst, and packaged luncheon meats. Salted nuts and seeds. Canned beans with salt. Dairy Whole or 2% milk, cream, half-and-half, and cream cheese. Whole-fat or sweetened yogurt. Full-fat cheeses or blue cheese. Nondairy creamers and whipped toppings. Processed cheese, cheese spreads, or cheese curds. Condiments Onion and garlic salt, seasoned salt, table salt, and sea salt. Canned and packaged gravies. Worcestershire sauce. Tartar sauce. Barbecue sauce. Teriyaki sauce. Soy sauce, including reduced sodium. Steak sauce. Fish sauce. Oyster sauce. Cocktail sauce. Horseradish. Ketchup and mustard. Meat flavorings and tenderizers. Bouillon cubes. Hot sauce. Tabasco sauce. Marinades. Taco seasonings. Relishes. Fats and Oils Butter, stick margarine, lard, shortening, ghee, and bacon fat. Coconut, palm kernel, or palm oils. Regular salad dressings. Other Pickles and olives. Salted popcorn and pretzels. The items listed above may not be a complete list of foods and beverages to avoid. Contact your dietitian for more information. WHERE CAN I FIND MORE INFORMATION? National Heart, Lung, and Blood Institute: travelstabloid.com Document Released: 08/13/2011 Document Revised: 01/08/2014 Document Reviewed: 06/28/2013 [facility] Patient Information 2015 [place], [place]. This information is not intended to replace advice given to you by your health care provider.  Make sure you discuss any questions you have with your health care provider. Hypertension Hypertension, commonly  called high blood pressure, is when the force of blood pumping through your arteries is too strong. Your arteries are the blood vessels that carry blood from your heart throughout your body. A blood pressure reading consists of a higher number over a lower number, such as 110/72. The higher number (systolic) is the pressure inside your arteries when your heart pumps. The lower number (diastolic) is the pressure inside your arteries when your heart relaxes. Ideally you want your blood pressure below 120/80. Hypertension forces your heart to work harder to pump blood. Your arteries may become narrow or stiff. Having hypertension puts you at risk for heart disease, stroke, and other problems.  RISK FACTORS Some risk factors for high blood pressure are controllable. Others are not.  Risk factors you cannot control include:   Race. You may be at higher risk if you are African American.  Age. Risk increases with age.  Gender. Men are at higher risk than women before age 44 years. After age 5, women are at higher risk than men. Risk factors you can control include:  Not getting enough exercise or physical activity.  Being overweight.  Getting too much fat, sugar, calories, or salt in your diet.  Drinking too much alcohol. SIGNS AND SYMPTOMS Hypertension does not usually cause signs or symptoms. Extremely high blood pressure (hypertensive crisis) may cause headache, anxiety, shortness of breath, and nosebleed. DIAGNOSIS  To check if you have hypertension, your health care provider will measure your blood pressure while you are seated, with your arm held at the level of your heart. It should be measured at least twice using the same arm. Certain conditions can cause a difference in blood pressure between your right and left arms. A blood pressure reading that is higher than normal on one occasion does not mean that you need treatment. If one blood pressure reading is high, ask your health care  provider about having it checked again. TREATMENT  Treating high blood pressure includes making lifestyle changes and possibly taking medicine. Living a healthy lifestyle can help lower high blood pressure. You may need to change some of your habits. Lifestyle changes may include:  Following the DASH diet. This diet is high in fruits, vegetables, and whole grains. It is low in salt, red meat, and added sugars.  Getting at least 2 hours of brisk physical activity every week.  Losing weight if necessary.  Not smoking.  Limiting alcoholic beverages.  Learning ways to reduce stress. If lifestyle changes are not enough to get your blood pressure under control, your health care provider may prescribe medicine. You may need to take more than one. Work closely with your health care provider to understand the risks and benefits. HOME CARE INSTRUCTIONS  Have your blood pressure rechecked as directed by your health care provider.   Take medicines only as directed by your health care provider. Follow the directions carefully. Blood pressure medicines must be taken as prescribed. The medicine does not work as well when you skip doses. Skipping doses also puts you at risk for problems.   Do not smoke.   Monitor your blood pressure at home as directed by your health care provider. SEEK MEDICAL CARE IF:   You think you are having a reaction to medicines taken.  You have recurrent headaches or feel dizzy.  You have swelling in your ankles.  You have trouble with your  vision. SEEK IMMEDIATE MEDICAL CARE IF:  You develop a severe headache or confusion.  You have unusual weakness, numbness, or feel faint.  You have severe chest or abdominal pain.  You vomit repeatedly.  You have trouble breathing. MAKE SURE YOU:   Understand these instructions.  Will watch your condition.  Will get help right away if you are not doing well or get worse. Document Released: 08/24/2005 Document  Revised: 01/08/2014 Document Reviewed: 06/16/2013 [facility] Patient Information 2015 [place], [place]. This information is not intended to replace advice given to you by your health care provider. Make sure you discuss any questions you have with your health care provider.

## 2014-11-06 NOTE — Progress Notes (Signed)
Patient ID: Kylie Harris, female   DOB: [birth year], 59 y.o.   MRN: [id number]   Kylie Harris, is a 59 y.o. female  NGE:[id number]  MWN:[id number]  DOB - [birth year]  Chief Complaint  Patient presents with  . Hypertension  . Follow-up        Subjective:   Kylie Harris is a 59 y.o. female here today for a follow up visit. Patient has hypertension, retinal detachment on the right came to clinic today for routine follow-up. Her major complaint is dizziness, feeling like ceruminous pain around her, started about 2 weeks ago around the same time that she developed symptoms of upper respiratory infection with runny nose and ear ache. She feels slightly better but she still occasionally feels dizzy especially when she bends over. She needed refills on one of her antihypertensives - Micardis, she is not able to afford this medication and she ran out of pass program, so she is requesting alternative that is affordable. Patient is also on clonidine, amlodipine, and metoprolol for hypertension. Blood pressure is controlled today. She still walks well without support. 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 No fever. Patient has No headache, No chest pain, No abdominal pain - No Nausea, No new weakness tingling or numbness, No Cough - SOB.  No problems updated.  ALLERGIES: No Known Allergies  PAST MEDICAL HISTORY: Past Medical History  Diagnosis Date  . Hypertension     takes Metoprolol,Micardis,Amlodipine,and CLonidine daily  . Cataract     left eye  . Glaucoma   . Detached retina     right    MEDICATIONS AT HOME: Prior to Admission medications   Medication Sig Start Date End Date Taking? Authorizing Provider  amLODipine (NORVASC) 10 MG tablet Take 1 tablet (10 mg total) by mouth daily. 11/06/14  Yes Tresa Garter, MD  cloNIDine (CATAPRES) 0.1 MG tablet Take 1 tablet (0.1 mg total) by mouth 2 (two) times  daily. 11/06/14  Yes Tresa Garter, MD  ibuprofen (ADVIL,MOTRIN) 200 MG tablet Take 200 mg by mouth every 6 (six) hours as needed. For pain   Yes Historical Provider, MD  metoprolol succinate (TOPROL-XL) 100 MG 24 hr tablet Take 1 tablet (100 mg total) by mouth daily. 03/01/14  Yes Tresa Garter, MD  telmisartan-hydrochlorothiazide (MICARDIS HCT) 40-12.5 MG per tablet Take 2 tablets by mouth daily. 03/01/14  Yes Tresa Garter, MD  terbinafine (LAMISIL) 250 MG tablet Take 1 tablet (250 mg total) by mouth daily. 08/28/14  Yes Tresa Garter, MD  vitamin C (ASCORBIC ACID) 500 MG tablet Take 500 mg by mouth daily.   Yes Historical Provider, MD  betamethasone dipropionate (DIPROLENE) 0.05 % cream Apply topically 2 (two) times daily. Patient not taking: Reported on 08/28/2014 11/07/12   Reyne Dumas, MD  citalopram (CELEXA) 20 MG tablet Take 1 tablet (20 mg total) by mouth daily. Patient not taking: Reported on 11/06/2014 02/16/13   Osborne Oman, MD  clotrimazole (LOTRIMIN) 1 % cream Apply to affected area 2 times daily Patient not taking: Reported on 08/28/2014 05/18/13   Billy Fischer, MD  clotrimazole-betamethasone (LOTRISONE) cream Apply topically 2 (two) times daily. Patient not taking: Reported on 08/28/2014 04/04/13   Nishant Dhungel, MD  hydrocortisone 1 % ointment Apply topically 2 (two) times daily. Patient not taking: Reported on 08/28/2014 01/17/13   Theodis Blaze, MD  losartan-hydrochlorothiazide (HYZAAR) 50-12.5 MG per  tablet Take 1 tablet by mouth daily. 11/06/14   Tresa Garter, MD  meclizine (ANTIVERT) 25 MG tablet Take 1 tablet (25 mg total) by mouth 3 (three) times daily as needed for dizziness. 11/06/14   Tresa Garter, MD  predniSONE (DELTASONE) 20 MG tablet Take 20 mg by mouth 3 (three) times daily.    Historical Provider, MD     Objective:   Filed Vitals:   11/06/14 1228  BP: 130/74  Pulse: 53  Temp: 97.4 F (36.3 C)  TempSrc: Oral  Resp: 18    SpO2: 100%    Exam General appearance : Awake, alert, not in any distress. Speech Clear. Not toxic looking HEENT: Atraumatic and Normocephalic, pupils equally reactive to light and accomodation Neck: supple, no JVD. No cervical lymphadenopathy.  Chest:Good air entry bilaterally, no added sounds  CVS: S1 S2 regular, no murmurs.  Abdomen: Bowel sounds present, Non tender and not distended with no gaurding, rigidity or rebound. Extremities: B/L Lower Ext shows no edema, both legs are warm to touch Neurology: Awake alert, and oriented X 3, CN II-XII intact, Non focal. Negative Romberg sign, no ataxia   Data Review Lab Results  Component Value Date   HGBA1C 5.6 03/01/2014   HGBA1C 9.0 07/17/2013     Assessment & Plan   1. Uncontrolled hypertension  - losartan-hydrochlorothiazide (HYZAAR) 50-12.5 MG per tablet; Take 1 tablet by mouth daily.  Dispense: 90 tablet; Refill: 3 - amLODipine (NORVASC) 10 MG tablet; Take 1 tablet (10 mg total) by mouth daily.  Dispense: 90 tablet; Refill: 3 - cloNIDine (CATAPRES) 0.1 MG tablet; Take 1 tablet (0.1 mg total) by mouth 2 (two) times daily.  Dispense: 180 tablet; Refill: 3 - DASH diet  2. Benign paroxysmal positional vertigo, bilateral  - meclizine (ANTIVERT) 25 MG tablet; Take 1 tablet (25 mg total) by mouth 3 (three) times daily as needed for dizziness.  Dispense: 30 tablet; Refill: 0   Patient was counseled extensively about nutrition and exercise.    Return in about 3 months (around 02/06/2015), or if symptoms worsen or fail to improve, for Follow up HTN, Annual Physical.  The patient was given clear instructions to go to ER or return to medical center if symptoms don't improve, worsen or new problems develop. The patient verbalized understanding. The patient was told to call to get lab results if they haven't heard anything in the next week.   This note has been created with Surveyor, quantity.  Any transcriptional errors are unintentional.    Angelica Chessman, MD, [employer], [place], [place] and [place], [place]   11/06/2014, 12:55 PM

## 2014-11-06 NOTE — Progress Notes (Signed)
Patient presents for f/u on HTN C/o 2 week hx dizziness when bending over, feeling "off balance" States this happened last week and passed out for several seconds (fell on floor) Ran out of micardis 2-3 weeks ago [employer] can no longer get micardis Requesting refills on clonidine, amlodipine, metoprolol, micardis and terbinafine Never been a smoker  BP 130/74  left arm manually with adult cuff P 53

## 2014-11-16 ENCOUNTER — Other Ambulatory Visit: Payer: Self-pay | Admitting: Internal Medicine

## 2014-11-19 ENCOUNTER — Other Ambulatory Visit: Payer: Self-pay | Admitting: Internal Medicine

## 2015-03-22 ENCOUNTER — Other Ambulatory Visit: Payer: Self-pay | Admitting: Internal Medicine

## 2015-05-08 ENCOUNTER — Ambulatory Visit: Payer: Self-pay | Attending: Internal Medicine

## 2015-09-30 ENCOUNTER — Other Ambulatory Visit: Payer: Self-pay | Admitting: Internal Medicine

## 2015-10-09 MED FILL — METOPROLOL SUCC ER 50 MG TA: 50 | 30 days supply | Qty: 60 | Fill #4

## 2015-10-09 MED FILL — ?CLONIDINE HCL 0.1 MG TABL: 0.1 | 30 days supply | Qty: 60 | Fill #5

## 2015-10-17 ENCOUNTER — Ambulatory Visit: Payer: Self-pay | Attending: Internal Medicine

## 2015-10-30 MED FILL — TERBINAFINE HCL 250 MG TAB: 250 | 30 days supply | Qty: 30 | Fill #0

## 2015-11-14 ENCOUNTER — Other Ambulatory Visit: Payer: Self-pay | Admitting: Internal Medicine

## 2015-11-14 MED FILL — cloNIDine HCL 0.1 MG TABS: 0.1 | 30 days supply | Qty: 60 | Fill #0

## 2015-11-14 MED FILL — METOPROLOL SUCC ER 50 MG TA: 50 | 30 days supply | Qty: 60 | Fill #5

## 2015-12-26 ENCOUNTER — Other Ambulatory Visit: Payer: Self-pay | Admitting: Internal Medicine

## 2015-12-30 ENCOUNTER — Other Ambulatory Visit: Payer: Self-pay | Admitting: Internal Medicine

## 2016-01-07 MED FILL — METOPROLOL SUCC ER 50 MG TA: 50 | 30 days supply | Qty: 60 | Fill #6

## 2016-01-07 MED FILL — ?CLONIDINE HCL 0.1 MG TABLE: 0.1 MG | 15 days supply | Qty: 30 | Fill #0

## 2016-02-20 ENCOUNTER — Encounter: Payer: Self-pay | Admitting: Internal Medicine

## 2016-02-20 ENCOUNTER — Ambulatory Visit: Payer: Self-pay | Attending: Internal Medicine | Admitting: Internal Medicine

## 2016-02-20 VITALS — BP 162/93 | HR 72 | Temp 98.0°F | Resp 18 | Ht 62.0 in | Wt 169.4 lb

## 2016-02-20 DIAGNOSIS — B351 Tinea unguium: Secondary | ICD-10-CM | POA: Insufficient documentation

## 2016-02-20 DIAGNOSIS — I10 Essential (primary) hypertension: Secondary | ICD-10-CM | POA: Insufficient documentation

## 2016-02-20 DIAGNOSIS — H5441 Blindness, right eye, normal vision left eye: Secondary | ICD-10-CM | POA: Insufficient documentation

## 2016-02-20 DIAGNOSIS — H548 Legal blindness, as defined in USA: Secondary | ICD-10-CM

## 2016-02-20 LAB — CBC WITH DIFFERENTIAL/PLATELET
BASOS ABS: 0 {cells}/uL (ref 0–200)
BASOS PCT: 0 %
EOS ABS: 0 {cells}/uL — AB (ref 15–500)
Eosinophils Relative: 0 %
HEMATOCRIT: 40.6 % (ref 35.0–45.0)
Hemoglobin: 13.1 g/dL (ref 11.7–15.5)
Lymphocytes Relative: 32 %
Lymphs Abs: 2592 cells/uL (ref 850–3900)
MCH: 26.3 pg — AB (ref 27.0–33.0)
MCHC: 32.3 g/dL (ref 32.0–36.0)
MCV: 81.4 fL (ref 80.0–100.0)
MONO ABS: 486 {cells}/uL (ref 200–950)
MONOS PCT: 6 %
MPV: 9.4 fL (ref 7.5–12.5)
NEUTROS ABS: 5022 {cells}/uL (ref 1500–7800)
Neutrophils Relative %: 62 %
Platelets: 371 10*3/uL (ref 140–400)
RBC: 4.99 MIL/uL (ref 3.80–5.10)
RDW: 14.6 % (ref 11.0–15.0)
WBC: 8.1 10*3/uL (ref 3.8–10.8)

## 2016-02-20 MED ORDER — AMLODIPINE BESYLATE 10 MG PO TABS: 10.0000 mg | ORAL_TABLET | Freq: Every day | ORAL | Status: DC

## 2016-02-20 MED ORDER — TERBINAFINE HCL 250 MG PO TABS
250.0000 mg | ORAL_TABLET | Freq: Every day | ORAL | Status: DC
Start: 2016-02-20 — End: 2017-01-27

## 2016-02-20 MED ORDER — LOSARTAN POTASSIUM-HCTZ 50-12.5 MG PO TABS: 1.0000 | ORAL_TABLET | Freq: Every day | ORAL | Status: DC

## 2016-02-20 MED ORDER — METOPROLOL SUCCINATE ER 50 MG PO TB24: 100.0000 mg | ORAL_TABLET | Freq: Every day | ORAL | Status: DC

## 2016-02-20 MED FILL — METOPROLOL SUCC ER 50 MG TA: 50 | 30 days supply | Qty: 60 | Fill #0

## 2016-02-20 MED FILL — AMLODIPINE BESYLATE 10 MG T: 10 | 30 days supply | Qty: 30 | Fill #0

## 2016-02-20 MED FILL — ?LOSARTAN-HCTZ 50-12.5 MG T: 50-12.5 | 30 days supply | Qty: 30 | Fill #0

## 2016-02-20 MED FILL — TERBINAFINE HCL 250 MG TAB: 250 | 30 days supply | Qty: 30 | Fill #0

## 2016-02-20 NOTE — Patient Instructions (Signed)
DASH Eating Plan °DASH stands for "Dietary Approaches to Stop Hypertension." The DASH eating plan is a healthy eating plan that has been shown to reduce high blood pressure (hypertension). Additional health benefits may include reducing the risk of type 2 diabetes mellitus, heart disease, and stroke. The DASH eating plan may also help with weight loss. °WHAT DO I NEED TO KNOW ABOUT THE DASH EATING PLAN? °For the DASH eating plan, you will follow these general guidelines: °· Choose foods with a percent daily value for sodium of less than 5% (as listed on the food label). °· Use salt-free seasonings or herbs instead of table salt or sea salt. °· Check with your health care provider or pharmacist before using salt substitutes. °· Eat lower-sodium products, often labeled as "lower sodium" or "no salt added." °· Eat fresh foods. °· Eat more vegetables, fruits, and low-fat dairy products. °· Choose whole grains. Look for the word "whole" as the first word in the ingredient list. °· Choose fish and skinless chicken or turkey more often than red meat. Limit fish, poultry, and meat to 6 oz (170 g) each day. °· Limit sweets, desserts, sugars, and sugary drinks. °· Choose heart-healthy fats. °· Limit cheese to 1 oz (28 g) per day. °· Eat more home-cooked food and less restaurant, buffet, and fast food. °· Limit fried foods. °· Cook foods using methods other than frying. °· Limit canned vegetables. If you do use them, rinse them well to decrease the sodium. °· When eating at a restaurant, ask that your food be prepared with less salt, or no salt if possible. °WHAT FOODS CAN I EAT? °Seek help from a dietitian for individual calorie needs. °Grains °Whole grain or whole wheat bread. Brown rice. Whole grain or whole wheat pasta. Quinoa, bulgur, and whole grain cereals. Low-sodium cereals. Corn or whole wheat flour tortillas. Whole grain cornbread. Whole grain crackers. Low-sodium crackers. °Vegetables °Fresh or frozen vegetables  (raw, steamed, roasted, or grilled). Low-sodium or reduced-sodium tomato and vegetable juices. Low-sodium or reduced-sodium tomato sauce and paste. Low-sodium or reduced-sodium canned vegetables.  °Fruits °All fresh, canned (in natural juice), or frozen fruits. °Meat and Other Protein Products °Ground beef (85% or leaner), grass-fed beef, or beef trimmed of fat. Skinless chicken or turkey. Ground chicken or turkey. Pork trimmed of fat. All fish and seafood. Eggs. Dried beans, peas, or lentils. Unsalted nuts and seeds. Unsalted canned beans. °Dairy °Low-fat dairy products, such as skim or 1% milk, 2% or reduced-fat cheeses, low-fat ricotta or cottage cheese, or plain low-fat yogurt. Low-sodium or reduced-sodium cheeses. °Fats and Oils °Tub margarines without trans fats. Light or reduced-fat mayonnaise and salad dressings (reduced sodium). Avocado. Safflower, olive, or canola oils. Natural peanut or almond butter. °Other °Unsalted popcorn and pretzels. °The items listed above may not be a complete list of recommended foods or beverages. Contact your dietitian for more options. °WHAT FOODS ARE NOT RECOMMENDED? °Grains °White bread. White pasta. White rice. Refined cornbread. Bagels and croissants. Crackers that contain trans fat. °Vegetables °Creamed or fried vegetables. Vegetables in a cheese sauce. Regular canned vegetables. Regular canned tomato sauce and paste. Regular tomato and vegetable juices. °Fruits °Dried fruits. Canned fruit in light or heavy syrup. Fruit juice. °Meat and Other Protein Products °Fatty cuts of meat. Ribs, chicken wings, bacon, sausage, bologna, salami, chitterlings, fatback, hot dogs, bratwurst, and packaged luncheon meats. Salted nuts and seeds. Canned beans with salt. °Dairy °Whole or 2% milk, cream, half-and-half, and cream cheese. Whole-fat or sweetened yogurt. Full-fat   cheeses or blue cheese. Nondairy creamers and whipped toppings. Processed cheese, cheese spreads, or cheese  curds. °Condiments °Onion and garlic salt, seasoned salt, table salt, and sea salt. Canned and packaged gravies. Worcestershire sauce. Tartar sauce. Barbecue sauce. Teriyaki sauce. Soy sauce, including reduced sodium. Steak sauce. Fish sauce. Oyster sauce. Cocktail sauce. Horseradish. Ketchup and mustard. Meat flavorings and tenderizers. Bouillon cubes. Hot sauce. Tabasco sauce. Marinades. Taco seasonings. Relishes. °Fats and Oils °Butter, stick margarine, lard, shortening, ghee, and bacon fat. Coconut, palm kernel, or palm oils. Regular salad dressings. °Other °Pickles and olives. Salted popcorn and pretzels. °The items listed above may not be a complete list of foods and beverages to avoid. Contact your dietitian for more information. °WHERE CAN I FIND MORE INFORMATION? °National Heart, Lung, and [employer] Institute: www.nhlbi.nih.gov/health/health-topics/topics/dash/ °  °This information is not intended to replace advice given to you by your health care provider. Make sure you discuss any questions you have with your health care provider. °  °Document Released: 08/13/2011 Document Revised: 09/14/2014 Document Reviewed: 06/28/2013 °Elsevier Interactive Patient Education ©2016 Elsevier Inc. ° °Hypertension °Hypertension, commonly called high blood pressure, is when the force of blood pumping through your arteries is too strong. Your arteries are the blood vessels that carry blood from your heart throughout your body. A blood pressure reading consists of a higher number over a lower number, such as 110/72. The higher number (systolic) is the pressure inside your arteries when your heart pumps. The lower number (diastolic) is the pressure inside your arteries when your heart relaxes. Ideally you want your blood pressure below 120/80. °Hypertension forces your heart to work harder to pump blood. Your arteries may become narrow or stiff. Having untreated or uncontrolled hypertension can cause heart attack, stroke, kidney  disease, and other problems. °RISK FACTORS °Some risk factors for high blood pressure are controllable. Others are not.  °Risk factors you cannot control include:  °· Race. You may be at higher risk if you are African American. °· Age. Risk increases with age. °· Gender. Men are at higher risk than women before age 45 years. After age 65, women are at higher risk than men. °Risk factors you can control include: °· Not getting enough exercise or physical activity. °· Being overweight. °· Getting too much fat, sugar, calories, or salt in your diet. °· Drinking too much alcohol. °SIGNS AND SYMPTOMS °Hypertension does not usually cause signs or symptoms. Extremely high blood pressure (hypertensive crisis) may cause headache, anxiety, shortness of breath, and nosebleed. °DIAGNOSIS °To check if you have hypertension, your health care provider will measure your blood pressure while you are seated, with your arm held at the level of your heart. It should be measured at least twice using the same arm. Certain conditions can cause a difference in blood pressure between your right and left arms. A blood pressure reading that is higher than normal on one occasion does not mean that you need treatment. If it is not clear whether you have high blood pressure, you may be asked to return on a different day to have your blood pressure checked again. Or, you may be asked to monitor your blood pressure at home for 1 or more weeks. °TREATMENT °Treating high blood pressure includes making lifestyle changes and possibly taking medicine. Living a healthy lifestyle can help lower high blood pressure. You may need to change some of your habits. °Lifestyle changes may include: °· Following the DASH diet. This diet is high in fruits, vegetables, and whole   grains. It is low in salt, red meat, and added sugars. °· Keep your sodium intake below 2,300 mg per day. °· Getting at least 30-45 minutes of aerobic exercise at least 4 times per  week. °· Losing weight if necessary. °· Not smoking. °· Limiting alcoholic beverages. °· Learning ways to reduce stress. °Your health care provider may prescribe medicine if lifestyle changes are not enough to get your blood pressure under control, and if one of the following is true: °· You are 18-59 years of age and your systolic blood pressure is above 140. °· You are 60 years of age or older, and your systolic blood pressure is above 150. °· Your diastolic blood pressure is above 90. °· You have diabetes, and your systolic blood pressure is over 140 or your diastolic blood pressure is over 90. °· You have kidney disease and your blood pressure is above 140/90. °· You have heart disease and your blood pressure is above 140/90. °Your personal target blood pressure may vary depending on your medical conditions, your age, and other factors. °HOME CARE INSTRUCTIONS °· Have your blood pressure rechecked as directed by your health care provider.   °· Take medicines only as directed by your health care provider. Follow the directions carefully. Blood pressure medicines must be taken as prescribed. The medicine does not work as well when you skip doses. Skipping doses also puts you at risk for problems. °· Do not smoke.   °· Monitor your blood pressure at home as directed by your health care provider.  °SEEK MEDICAL CARE IF:  °· You think you are having a reaction to medicines taken. °· You have recurrent headaches or feel dizzy. °· You have swelling in your ankles. °· You have trouble with your vision. °SEEK IMMEDIATE MEDICAL CARE IF: °· You develop a severe headache or confusion. °· You have unusual weakness, numbness, or feel faint. °· You have severe chest or abdominal pain. °· You vomit repeatedly. °· You have trouble breathing. °MAKE SURE YOU:  °· Understand these instructions. °· Will watch your condition. °· Will get help right away if you are not doing well or get worse. °  °This information is not intended to  replace advice given to you by your health care provider. Make sure you discuss any questions you have with your health care provider. °  °Document Released: 08/24/2005 Document Revised: 01/08/2015 Document Reviewed: 06/16/2013 °Elsevier Interactive Patient Education ©2016 Elsevier Inc. ° °

## 2016-02-20 NOTE — Progress Notes (Signed)
Patient is here for FU BP  Patient denies pain at this time.  Patient has not taken medication today. Patient needs refills on all appropriate medications.   Patient has eaten today.

## 2016-02-20 NOTE — Progress Notes (Signed)
Subjective:  Patient ID: Kylie Harris, female    DOB: 11/23/1955  Age: 60 y.o. MRN: [id number]  CC: Hypertension   HPI Kylie Harris is a 60 y.o. female with a PMH significant for HTN who presents for follow up on HTN. Patient reports that she is here today for refill on her BP medications as she has been out for a month. Pharmacy would not refill until she came in for an appointment. She has reported having a few minor headaches but denies any chest pain, abdominal pain, edema, and SOB.    Patient also requests a refill on terbafine as it helped her onychomycosis in her fingernails, but she now has it in her toe nails.   Patients also complains of eye redness. She had went to [facility] for eye exam where the eye doctor prescribed her eye drops to take once a day. She reports that after taking it she felt burning pain and discontinued. Patient said she has already scheduled an appointment with another eye doctor in 2 weeks.  Outpatient Prescriptions Prior to Visit  Medication Sig Dispense Refill  . ibuprofen (ADVIL,MOTRIN) 200 MG tablet Take 200 mg by mouth every 6 (six) hours as needed. For pain    . meclizine (ANTIVERT) 25 MG tablet Take 1 tablet (25 mg total) by mouth 3 (three) times daily as needed for dizziness. 30 tablet 0  . vitamin C (ASCORBIC ACID) 500 MG tablet Take 500 mg by mouth daily.    Marland Kitchen amLODipine (NORVASC) 10 MG tablet Take 1 tablet (10 mg total) by mouth daily. 90 tablet 3  . losartan-hydrochlorothiazide (HYZAAR) 50-12.5 MG per tablet Take 1 tablet by mouth daily. 90 tablet 3  . metoprolol succinate (TOPROL-XL) 50 MG 24 hr tablet TAKE 2 TABLETS BY MOUTH DAILY. 180 tablet 3  . terbinafine (LAMISIL) 250 MG tablet TAKE 1 TABLET BY MOUTH DAILY 90 tablet 3  . betamethasone dipropionate (DIPROLENE) 0.05 % cream Apply topically 2 (two) times daily. (Patient not taking: Reported on 08/28/2014) 30 g 2  . citalopram (CELEXA) 20 MG tablet Take 1 tablet (20 mg total) by mouth  daily. (Patient not taking: Reported on 11/06/2014) 30 tablet 2  . cloNIDine (CATAPRES) 0.1 MG tablet TAKE 1 TABLET BY MOUTH 2 TIMES DAILY (Patient not taking: Reported on 02/20/2016) 30 tablet 0  . clotrimazole (LOTRIMIN) 1 % cream Apply to affected area 2 times daily (Patient not taking: Reported on 08/28/2014) 60 g 0  . clotrimazole-betamethasone (LOTRISONE) cream Apply topically 2 (two) times daily. (Patient not taking: Reported on 08/28/2014) 30 g 0  . hydrocortisone 1 % ointment Apply topically 2 (two) times daily. (Patient not taking: Reported on 08/28/2014) 30 g 0  . predniSONE (DELTASONE) 20 MG tablet Take 20 mg by mouth 3 (three) times daily.    Marland Kitchen telmisartan-hydrochlorothiazide (MICARDIS HCT) 40-12.5 MG per tablet Take 2 tablets by mouth daily. 180 tablet 3   No facility-administered medications prior to visit.    ROS Review of Systems  Constitutional: Negative for fever and unexpected weight change.  Eyes: Positive for redness.  Respiratory: Negative for chest tightness and shortness of breath.   Cardiovascular: Negative for chest pain, palpitations and leg swelling.  Gastrointestinal: Negative for nausea, vomiting, abdominal pain, diarrhea and constipation.  Skin: Negative for rash.  Neurological: Positive for headaches. Negative for dizziness.  Psychiatric/Behavioral: Negative for suicidal ideas, sleep disturbance and self-injury. The patient is not nervous/anxious.     Objective:  BP 162/93 mmHg  Pulse  72  Temp(Src) 98 F (36.7 C) (Oral)  Resp 18  Ht 5\' 2"  (1.575 m)  Wt 169 lb 6.4 oz (76.839 kg)  BMI 30.98 kg/m2  SpO2 100%  BP/Weight 02/20/2016 11/06/2014 [id number]  Systolic BP 0000000 [id number] 90  Diastolic BP 93 74 60  Wt. (Lbs) 169.4 - 168  BMI 30.98 - 30.72    Lab Results  Component Value Date   WBC 8.0 03/01/2014   HGB 13.6 03/01/2014   HCT 40.8 03/01/2014   PLT 407* 03/01/2014   GLUCOSE 89 03/01/2014   CHOL 167 03/01/2014   TRIG 131 03/01/2014   HDL 55  03/01/2014   LDLCALC 86 03/01/2014   ALT 9 08/28/2014   AST 13 08/28/2014   NA 136 03/01/2014   K 4.4 03/01/2014   CL 100 03/01/2014   CREATININE 0.62 03/01/2014   BUN 13 03/01/2014   CO2 28 03/01/2014   TSH 1.217 03/01/2014   HGBA1C 5.6 03/01/2014    Physical Exam  Constitutional: She is oriented to person, place, and time. She appears well-developed and well-nourished.  HENT:  Head: Normocephalic and atraumatic.  Eyes: EOM are normal. Pupils are equal, round, and reactive to light.  Eye Redness  Cardiovascular: Normal rate, regular rhythm and intact distal pulses.  Exam reveals no gallop and no friction rub.   No murmur heard. Pulmonary/Chest: Effort normal and breath sounds normal. She has no wheezes. She has no rales.  Abdominal: Soft. Bowel sounds are normal. She exhibits no distension. There is no tenderness. There is no rebound and no guarding.  Musculoskeletal: Normal range of motion.  Neurological: She is alert and oriented to person, place, and time.  Skin: Skin is warm and dry. No rash noted.  Psychiatric: She has a normal mood and affect. Her behavior is normal. Judgment and thought content normal.     Assessment & Plan:   1. Uncontrolled hypertension Patient has been out of medication for a month. Patient reports being compliant with low salt diet. Refilled medication.  Counseled patient on continuing her medication regimen and DASH diet, along with exercise to keep BP under control. - losartan-hydrochlorothiazide (HYZAAR) 50-12.5 MG tablet; Take 1 tablet by mouth daily.  Dispense: 90 tablet; Refill: 3 - amLODipine (NORVASC) 10 MG tablet; Take 1 tablet (10 mg total) by mouth daily.  Dispense: 90 tablet; Refill: 3 - metoprolol succinate (TOPROL-XL) 50 MG 24 hr tablet; Take 2 tablets (100 mg total) by mouth daily.  Dispense: 180 tablet; Refill: 3 - COMPLETE METABOLIC PANEL WITH GFR - CBC with Differential/Platelet - Lipid panel - TSH  2. Onychomycosis Patient  reports that onychomycosis present in her finger nails is gone, but she has some in her toe nails. Refilled medication. - terbinafine (LAMISIL) 250 MG tablet; Take 1 tablet (250 mg total) by mouth daily.  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. She has d/c the eyedrops and will go see another eye doctor in 2 weeks.    Meds ordered this encounter  Medications  . losartan-hydrochlorothiazide (HYZAAR) 50-12.5 MG tablet    Sig: Take 1 tablet by mouth daily.    Dispense:  90 tablet    Refill:  3  . amLODipine (NORVASC) 10 MG tablet    Sig: Take 1 tablet (10 mg total) by mouth daily.    Dispense:  90 tablet    Refill:  3  . metoprolol succinate (TOPROL-XL) 50 MG 24 hr tablet    Sig: Take 2 tablets (100 mg total) by mouth daily.    Dispense:  180 tablet    Refill:  3  . terbinafine (LAMISIL) 250 MG tablet    Sig: Take 1 tablet (250 mg total) by mouth daily.    Dispense:  90 tablet    Refill:  3    Follow-up: Return in about 3 months (around 05/22/2016) for Follow up HTN, Annual Physical.   Warnell Forester [place] Student   Evaluation and management procedures were performed by me with Medical Student in attendance, note written by medical student under my supervision and collaboration. I have reviewed the note and I agree with the management and plan.   Angelica Chessman, MD, [place], [place], [place], [place] and [place], [place]   02/20/2016, 6:35 PM

## 2016-02-21 LAB — LIPID PANEL
Cholesterol: 176 mg/dL (ref 125–200)
HDL: 65 mg/dL (ref >=46)
LDL CALC: 98 mg/dL (ref <130)
Total CHOL/HDL Ratio: 2.7 Ratio (ref <=5.0)
Triglycerides: 64 mg/dL (ref <150)
VLDL: 13 mg/dL (ref <30)

## 2016-02-21 LAB — COMPLETE METABOLIC PANEL WITH GFR
ALT: 15 U/L (ref 6–29)
AST: 18 U/L (ref 10–35)
Albumin: 4.2 g/dL (ref 3.6–5.1)
Alkaline Phosphatase: 77 U/L (ref 33–130)
BILIRUBIN TOTAL: 0.3 mg/dL (ref 0.2–1.2)
BUN: 11 mg/dL (ref 7–25)
CHLORIDE: 101 mmol/L (ref 98–110)
CO2: 27 mmol/L (ref 20–31)
CREATININE: 0.61 mg/dL (ref 0.50–1.05)
Calcium: 9.5 mg/dL (ref 8.6–10.4)
GFR, Est African American: >89 mL/min (ref >=60)
GLUCOSE: 96 mg/dL (ref 65–99)
Potassium: 3.6 mmol/L (ref 3.5–5.3)
SODIUM: 140 mmol/L (ref 135–146)
TOTAL PROTEIN: 7.3 g/dL (ref 6.1–8.1)

## 2016-02-21 LAB — TSH: TSH: 1.98 m[IU]/L

## 2016-02-27 ENCOUNTER — Telehealth: Payer: Self-pay | Admitting: *Deleted

## 2016-02-27 NOTE — Telephone Encounter (Signed)
-----   Message from Tresa Garter, MD sent at 02/24/2016  3:56 PM EDT ----- Please inform patient that her lab result are all normal.

## 2016-02-27 NOTE — Telephone Encounter (Signed)
MA unable to leave a VM. Patients contact rang and then disconnected.  !!!Please inform patient of all labs being normal!!!

## 2016-04-01 MED FILL — TERBINAFINE HCL 250 MG TAB: 250 | 30 days supply | Qty: 30 | Fill #1

## 2016-04-01 MED FILL — LOSARTAN-HCTZ 50-12.5 MG TA: 50-12.5 | 30 days supply | Qty: 30 | Fill #1

## 2016-04-01 MED FILL — METOPROLOL SUCC ER 50 MG TA: 50 | 30 days supply | Qty: 60 | Fill #1

## 2016-04-01 MED FILL — AMLODIPINE BESYLATE 10 MG T: 10 | 30 days supply | Qty: 30 | Fill #1

## 2016-04-02 ENCOUNTER — Ambulatory Visit: Payer: Self-pay | Attending: Internal Medicine

## 2016-06-30 MED FILL — ?AMLODIPINE BESYLATE 10 MG: 10 | 30 days supply | Qty: 30 | Fill #2

## 2016-06-30 MED FILL — METOPROLOL SUCC ER 50 MG TA: 50 | 30 days supply | Qty: 60 | Fill #2

## 2016-06-30 MED FILL — TERBINAFINE HCL 250 MG TAB: 250 | 30 days supply | Qty: 30 | Fill #2

## 2016-06-30 MED FILL — LOSARTAN-HCTZ 50-12.5 MG TA: 50-12.5 | 30 days supply | Qty: 30 | Fill #2

## 2016-09-11 ENCOUNTER — Telehealth: Payer: Self-pay | Admitting: Internal Medicine

## 2016-09-11 DIAGNOSIS — I10 Essential (primary) hypertension: Secondary | ICD-10-CM

## 2016-09-11 MED ORDER — AMLODIPINE BESYLATE 10 MG PO TABS: 10.0000 mg | ORAL_TABLET | Freq: Every day | ORAL | 0 refills | Status: DC

## 2016-09-11 MED ORDER — METOPROLOL SUCCINATE ER 50 MG PO TB24: 100.0000 mg | ORAL_TABLET | Freq: Every day | ORAL | 0 refills | Status: DC

## 2016-09-11 MED ORDER — LOSARTAN POTASSIUM-HCTZ 50-12.5 MG PO TABS: 1.0000 | ORAL_TABLET | Freq: Every day | ORAL | 0 refills | Status: DC

## 2016-09-11 MED FILL — TERBINAFINE HCL 250 MG TAB: 250 | 30 days supply | Qty: 30 | Fill #3

## 2016-09-11 MED FILL — LOSARTAN-HCTZ 50-12.5 MG TA: 50-12.5 | 30 days supply | Qty: 30 | Fill #3

## 2016-09-11 MED FILL — ?AMLODIPINE BESYLATE 10 MG: 10 | 30 days supply | Qty: 30 | Fill #3

## 2016-09-11 MED FILL — METOPROLOL SUCC ER 50 MG TA: 50 | 30 days supply | Qty: 60 | Fill #3

## 2016-09-11 NOTE — Telephone Encounter (Signed)
Refilled requested medications - patient must have office visit for further refills.

## 2016-09-11 NOTE — Telephone Encounter (Signed)
Medication Refill:   amLODipine (NORVASC) 10 MG tablet   losartan-hydrochlorothiazide (HYZAAR) 50-12.5 MG tablet   metoprolol succinate (TOPROL-XL) 50 MG 24 hr tablet

## 2016-09-21 ENCOUNTER — Ambulatory Visit: Payer: Self-pay | Attending: Internal Medicine

## 2016-10-29 MED FILL — METOPROLOL SUCC ER 50 MG TA: 50 | 30 days supply | Qty: 60 | Fill #4

## 2016-10-29 MED FILL — TERBINAFINE HCL 250 MG TAB: 250 | 30 days supply | Qty: 30 | Fill #4

## 2016-10-29 MED FILL — LOSARTAN-HCTZ 50-12.5 MG TA: 50-12.5 | 30 days supply | Qty: 30 | Fill #4

## 2016-10-29 MED FILL — AMLODIPINE BESYLATE 10 MG T: 10 | 30 days supply | Qty: 30 | Fill #4

## 2016-12-15 ENCOUNTER — Encounter (HOSPITAL_COMMUNITY): Payer: Self-pay

## 2016-12-15 ENCOUNTER — Emergency Department (HOSPITAL_COMMUNITY)
Admission: EM | Admit: 2016-12-15 | Discharge: 2016-12-15 | Disposition: A | Discharge status: Home / Self Care | Payer: Self-pay | Attending: Emergency Medicine | Admitting: Emergency Medicine

## 2016-12-15 DIAGNOSIS — Z79899 Other long term (current) drug therapy: Secondary | ICD-10-CM | POA: Insufficient documentation

## 2016-12-15 DIAGNOSIS — I10 Essential (primary) hypertension: Secondary | ICD-10-CM | POA: Insufficient documentation

## 2016-12-15 DIAGNOSIS — R42 Dizziness and giddiness: Secondary | ICD-10-CM | POA: Insufficient documentation

## 2016-12-15 HISTORY — DX: Dizziness and giddiness: R42

## 2016-12-15 LAB — COMPREHENSIVE METABOLIC PANEL
ALT: 21 U/L (ref 14–54)
AST: 22 U/L (ref 15–41)
Albumin: 4.3 g/dL (ref 3.5–5.0)
Alkaline Phosphatase: 84 U/L (ref 38–126)
Anion gap: 6 (ref 5–15)
BUN: 7 mg/dL (ref 6–20)
CO2: 28 mmol/L (ref 22–32)
Calcium: 9.5 mg/dL (ref 8.9–10.3)
Chloride: 105 mmol/L (ref 101–111)
Creatinine, Ser: 0.66 mg/dL (ref 0.44–1.00)
GFR calc Af Amer: >60 mL/min (ref >60)
GFR calc non Af Amer: >60 mL/min (ref >60)
Glucose, Bld: 121 mg/dL — ABNORMAL HIGH (ref 65–99)
Potassium: 3.8 mmol/L (ref 3.5–5.1)
Sodium: 139 mmol/L (ref 135–145)
Total Bilirubin: 0.7 mg/dL (ref 0.3–1.2)
Total Protein: 7.8 g/dL (ref 6.5–8.1)

## 2016-12-15 LAB — CBC
HCT: 41.6 % (ref 36.0–46.0)
Hemoglobin: 13.6 g/dL (ref 12.0–15.0)
MCH: 27.4 pg (ref 26.0–34.0)
MCHC: 32.7 g/dL (ref 30.0–36.0)
MCV: 83.7 fL (ref 78.0–100.0)
Platelets: 377 10*3/uL (ref 150–400)
RBC: 4.97 MIL/uL (ref 3.87–5.11)
RDW: 13.6 % (ref 11.5–15.5)
WBC: 8 10*3/uL (ref 4.0–10.5)

## 2016-12-15 MED ORDER — MECLIZINE HCL 25 MG PO TABS: 25.0000 mg | ORAL_TABLET | Freq: Three times a day (TID) | ORAL | 0 refills | Status: DC | PRN

## 2016-12-15 MED ORDER — PROMETHAZINE HCL 25 MG PO TABS
25.0000 mg | ORAL_TABLET | Freq: Once | ORAL | Status: AC
  Administered 2016-12-15: 25 mg via ORAL
  Filled 2016-12-15: qty 1

## 2016-12-15 MED ORDER — MECLIZINE HCL 25 MG PO TABS
25.0000 mg | ORAL_TABLET | Freq: Once | ORAL | Status: AC
  Administered 2016-12-15: 25 mg via ORAL
  Filled 2016-12-15: qty 1

## 2016-12-15 MED FILL — ?MECLIZINE 25 MG TABLET: 25 | 10 days supply | Qty: 30 | Fill #0

## 2016-12-15 NOTE — ED Provider Notes (Signed)
[facility] DEPT Provider Note   CSN: [id number] Arrival date & time: 12/15/16  4270  By signing my name below, I, Sonum Patel, attest that this documentation has been prepared under the direction and in the presence of Virgel Manifold, MD. Electronically Signed: Sonum Patel, Education administrator. 12/15/16. 11:14 AM.  History   Chief Complaint Chief Complaint  Patient presents with  . Dizziness    The history is provided by the patient. No language interpreter was used.     HPI Comments: Kylie Harris is a 61 y.o. female who presents to the Emergency Department complaining of an episode of dizziness that began about 8-9 hours ago. Patient states she woke up in the middle of the night to use the bathroom when she felt things spin and felt off-balance when walking. She reports associated nausea, 1 episode of vomiting, and 1 episode of diarrhea today. She has a history of vertigo a few years ago and states this feels similar. She denies HA, CP, SOB, numbness, paresthesia, vision changes.   Past Medical History:  Diagnosis Date  . Cataract    left eye  . Detached retina    right  . Glaucoma   . Hypertension    takes Metoprolol,Micardis,Amlodipine,and CLonidine daily  . Vertigo     Patient Active Problem List   Diagnosis Date Noted  . Essential hypertension 08/28/2014  . Onychomycosis 03/01/2014  . Legally blind in right eye, as defined in [place] 03/01/2014  . Uncontrolled hypertension 03/01/2014  . Glaucoma 03/01/2014  . Breast cancer screening 03/01/2014  . Tinea cruris 04/04/2013  . 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 THYROID NODULE 03/11/2010  . ERUCTATION 01/27/2008  . HYPERTENSION 05/19/2007    Past Surgical History:  Procedure Laterality Date  . EYE SURGERY     cataract removed from right  . MEMBRANE PEEL  09/15/2012   Procedure: MEMBRANE PEEL;  Surgeon: Hurman Horn, MD;  Location: [facility];  Service: Ophthalmology;  Laterality: Right;  . PARS PLANA  VITRECTOMY  09/15/2012   Procedure: PARS PLANA VITRECTOMY WITH 25 GAUGE;  Surgeon: Hurman Horn, MD;  Location: [facility];  Service: Ophthalmology;  Laterality: Right;  SILICONE OIL  . [place] INJECTION  09/15/2012   Procedure: PERFLUORONE INJECTION;  Surgeon: Hurman Horn, MD;  Location: [facility];  Service: Ophthalmology;  Laterality: Right;  . PHOTOCOAGULATION WITH LASER  09/15/2012   Procedure: PHOTOCOAGULATION WITH LASER;  Surgeon: Hurman Horn, MD;  Location: [facility];  Service: Ophthalmology;  Laterality: Right;  ENDOLASER  . SCLERAL BUCKLE WITH CRYO  07/08/2012   Procedure: SCLERAL BUCKLE WITH CRYO;  Surgeon: Hurman Horn, MD;  Location: [facility];  Service: Ophthalmology;  Laterality: Right;  CRYOPEXY  . TOTAL ABDOMINAL HYSTERECTOMY  1990   Done for fibroids and bleeding    OB History    Gravida Para Term Preterm AB Living   2       2 0   SAB TAB Ectopic Multiple Live Births   2               Home Medications    Prior to Admission medications   Medication Sig Start Date End Date Taking? Authorizing Provider  amLODipine (NORVASC) 10 MG tablet Take 1 tablet (10 mg total) by mouth daily. 09/11/16   Tresa Garter, MD  ibuprofen (ADVIL,MOTRIN) 200 MG tablet Take 200 mg by mouth every 6 (six) hours as needed. For pain    Historical Provider,  MD  losartan-hydrochlorothiazide (HYZAAR) 50-12.5 MG tablet Take 1 tablet by mouth daily. 09/11/16   Tresa Garter, MD  meclizine (ANTIVERT) 25 MG tablet Take 1 tablet (25 mg total) by mouth 3 (three) times daily as needed for dizziness. 11/06/14   Tresa Garter, MD  metoprolol succinate (TOPROL-XL) 50 MG 24 hr tablet Take 2 tablets (100 mg total) by mouth daily. 09/11/16   Tresa Garter, MD  terbinafine (LAMISIL) 250 MG tablet Take 1 tablet (250 mg total) by mouth daily. 02/20/16   Tresa Garter, MD  vitamin C (ASCORBIC ACID) 500 MG tablet Take 500 mg by mouth daily.    Historical Provider, MD    Family History Family History    Problem Relation Age of Onset  . Hypertension Mother   . Heart failure Mother   . Diabetes Mother     Social History Social History  Substance Use Topics  . Smoking status: Never Smoker  . Smokeless tobacco: Never Used  . Alcohol use 1.2 oz/week    2 Glasses of wine per week     Comment: states, "every now and then"     Allergies   Patient has no known allergies.   Review of Systems Review of Systems A complete 10 system review of systems was obtained and all systems are negative except as noted in the HPI and PMH.    Physical Exam Updated Vital Signs BP (!) 146/74   Pulse (!) 53   Temp 97.5 F (36.4 C) (Oral)   Resp 14   Ht 5\' 2"  (1.575 m)   Wt 185 lb (83.9 kg)   SpO2 98%   BMI 33.84 kg/m   Physical Exam  Constitutional: She is oriented to person, place, and time. She appears well-developed and well-nourished. No distress.  HENT:  Head: Normocephalic and atraumatic.  Eyes: EOM are normal.  Pupils are irregular. Right pupil does not react to light.   Neck: Normal range of motion.  Cardiovascular: Normal rate, regular rhythm and normal heart sounds.   Pulmonary/Chest: Effort normal and breath sounds normal.  Abdominal: Soft. She exhibits no distension. There is no tenderness.  Musculoskeletal: Normal range of motion.  Neurological: She is alert and oriented to person, place, and time. She has normal strength. No cranial nerve deficit or sensory deficit. She exhibits normal muscle tone. Coordination normal.  Good finger to nose testing.   Skin: Skin is warm and dry.  Psychiatric: She has a normal mood and affect. Judgment normal.  Nursing note and vitals reviewed.    ED Treatments / Results  DIAGNOSTIC STUDIES: Oxygen Saturation is 100% on RA, normal by my interpretation.    COORDINATION OF CARE: 11:11 AM Discussed treatment plan with pt at bedside and pt agreed to plan.   Labs (all labs ordered are listed, but only abnormal results are  displayed) Labs Reviewed  COMPREHENSIVE METABOLIC PANEL - Abnormal; Notable for the following:       Result Value   Glucose, Bld 121 (*)    All other components within normal limits  CBC    EKG  EKG Interpretation  Date/Time:  Tuesday December 15 2016 09:51:28 EDT Ventricular Rate:  56 PR Interval:  160 QRS Duration: 80 QT Interval:  454 QTC Calculation: 438 R Axis:   4 Text Interpretation:  Sinus bradycardia Cannot rule out Anterior infarct , age undetermined Abnormal ECG Confirmed by Wilson Singer  MD, Shaquavia Whisonant [phone]) on 12/15/2016 10:56:01 AM       Radiology  No results found.  Procedures Procedures (including critical care time)  Medications Ordered in ED Medications - No data to display   Initial Impression / Assessment and Plan / ED Course  I have reviewed the triage vital signs and the nursing notes.  Pertinent labs & imaging results that were available during my care of the patient were reviewed by me and considered in my medical decision making (see chart for details).      Final Clinical Impressions(s) / ED Diagnoses   Final diagnoses:  Vertigo    New Prescriptions New Prescriptions   No medications on file   I personally preformed the services scribed in my presence. The recorded information has been reviewed is accurate. Virgel Manifold, MD.    Virgel Manifold, MD 12/24/16 1024

## 2016-12-15 NOTE — ED Triage Notes (Signed)
Per Pt, Pt is coming from home where she started to note dizziness and nausea/vomiting that started last night. Pt reports having a hx of the same and was diagnosed with vertigo. Pt has HX of HTN as well. Alert and oriented x4. Denies slurred speech or numbness or tingling.

## 2016-12-29 MED FILL — METOPROLOL SUCC ER 50 MG TA: 50 | 30 days supply | Qty: 60 | Fill #5

## 2016-12-29 MED FILL — LOSARTAN-HCTZ 50-12.5 MG TA: 50-12.5 | 30 days supply | Qty: 30 | Fill #5

## 2016-12-29 MED FILL — TERBINAFINE HCL 250 MG TAB: 250 | 30 days supply | Qty: 30 | Fill #5

## 2016-12-29 MED FILL — ?AMLODIPINE BESYLATE 10 MG: 10 | 30 days supply | Qty: 30 | Fill #5

## 2017-01-27 ENCOUNTER — Encounter: Payer: Self-pay | Admitting: Internal Medicine

## 2017-01-27 ENCOUNTER — Ambulatory Visit: Payer: Self-pay | Attending: Internal Medicine | Admitting: Internal Medicine

## 2017-01-27 VITALS — BP 119/61 | HR 73 | Temp 98.3°F | Resp 18 | Ht 61.0 in | Wt 167.0 lb

## 2017-01-27 DIAGNOSIS — R42 Dizziness and giddiness: Secondary | ICD-10-CM | POA: Insufficient documentation

## 2017-01-27 DIAGNOSIS — Z131 Encounter for screening for diabetes mellitus: Secondary | ICD-10-CM

## 2017-01-27 DIAGNOSIS — I10 Essential (primary) hypertension: Secondary | ICD-10-CM

## 2017-01-27 DIAGNOSIS — Z79899 Other long term (current) drug therapy: Secondary | ICD-10-CM | POA: Insufficient documentation

## 2017-01-27 DIAGNOSIS — B351 Tinea unguium: Secondary | ICD-10-CM

## 2017-01-27 LAB — POCT GLYCOSYLATED HEMOGLOBIN (HGB A1C): HEMOGLOBIN A1C: 5.6

## 2017-01-27 MED ORDER — METOPROLOL SUCCINATE ER 50 MG PO TB24: 100.0000 mg | ORAL_TABLET | Freq: Every day | ORAL | 3 refills | Status: DC

## 2017-01-27 MED ORDER — AMLODIPINE BESYLATE 10 MG PO TABS: 10.0000 mg | ORAL_TABLET | Freq: Every day | ORAL | 3 refills | Status: DC

## 2017-01-27 MED ORDER — LOSARTAN POTASSIUM-HCTZ 50-12.5 MG PO TABS: 1.0000 | ORAL_TABLET | Freq: Every day | ORAL | 3 refills | Status: DC

## 2017-01-27 MED ORDER — TERBINAFINE HCL 250 MG PO TABS: 250.0000 mg | ORAL_TABLET | Freq: Every day | ORAL | 3 refills | Status: DC

## 2017-01-27 NOTE — Patient Instructions (Signed)
DASH Eating Plan DASH stands for "Dietary Approaches to Stop Hypertension." The DASH eating plan is a healthy eating plan that has been shown to reduce high blood pressure (hypertension). It may also reduce your risk for type 2 diabetes, heart disease, and stroke. The DASH eating plan may also help with weight loss. What are tips for following this plan? General guidelines   Avoid eating more than 2,300 mg (milligrams) of salt (sodium) a day. If you have hypertension, you may need to reduce your sodium intake to 1,500 mg a day.  Limit alcohol intake to no more than 1 drink a day for nonpregnant women and 2 drinks a day for men. One drink equals 12 oz of beer, 5 oz of wine, or 1 oz of hard liquor.  Work with your health care provider to maintain a healthy body weight or to lose weight. Ask what an ideal weight is for you.  Get at least 30 minutes of exercise that causes your heart to beat faster (aerobic exercise) most days of the week. Activities may include walking, swimming, or biking.  Work with your health care provider or diet and nutrition specialist (dietitian) to adjust your eating plan to your individual calorie needs. Reading food labels   Check food labels for the amount of sodium per serving. Choose foods with less than 5 percent of the Daily Value of sodium. Generally, foods with less than 300 mg of sodium per serving fit into this eating plan.  To find whole grains, look for the word "whole" as the first word in the ingredient list. Shopping   Buy products labeled as "low-sodium" or "no salt added."  Buy fresh foods. Avoid canned foods and premade or frozen meals. Cooking   Avoid adding salt when cooking. Use salt-free seasonings or herbs instead of table salt or sea salt. Check with your health care provider or pharmacist before using salt substitutes.  Do not fry foods. Cook foods using healthy methods such as baking, boiling, grilling, and broiling instead.  Cook with  heart-healthy oils, such as olive, canola, soybean, or sunflower oil. Meal planning    Eat a balanced diet that includes:  5 or more servings of fruits and vegetables each day. At each meal, try to fill half of your plate with fruits and vegetables.  Up to 6-8 servings of whole grains each day.  Less than 6 oz of lean meat, poultry, or fish each day. A 3-oz serving of meat is about the same size as a deck of cards. One egg equals 1 oz.  2 servings of low-fat dairy each day.  A serving of nuts, seeds, or beans 5 times each week.  Heart-healthy fats. Healthy fats called Omega-3 fatty acids are found in foods such as flaxseeds and coldwater fish, like sardines, salmon, and mackerel.  Limit how much you eat of the following:  Canned or prepackaged foods.  Food that is high in trans fat, such as fried foods.  Food that is high in saturated fat, such as fatty meat.  Sweets, desserts, sugary drinks, and other foods with added sugar.  Full-fat dairy products.  Do not salt foods before eating.  Try to eat at least 2 vegetarian meals each week.  Eat more home-cooked food and less restaurant, buffet, and fast food.  When eating at a restaurant, ask that your food be prepared with less salt or no salt, if possible. What foods are recommended? The items listed may not be a complete list. Talk   with your dietitian about what dietary choices are best for you. Grains  Whole-grain or whole-wheat bread. Whole-grain or whole-wheat pasta. Brown rice. [place]. Bulgur. Whole-grain and low-sodium cereals. Pita bread. Low-fat, low-sodium crackers. Whole-wheat flour tortillas. Vegetables  Fresh or frozen vegetables (raw, steamed, roasted, or grilled). Low-sodium or reduced-sodium tomato and vegetable juice. Low-sodium or reduced-sodium tomato sauce and tomato paste. Low-sodium or reduced-sodium canned vegetables. Fruits  All fresh, dried, or frozen fruit. Canned fruit in natural juice  (without added sugar). Meat and other protein foods  Skinless chicken or [place]. Ground chicken or [place]. Pork with fat trimmed off. Fish and seafood. Egg whites. Dried beans, peas, or lentils. Unsalted nuts, nut butters, and seeds. Unsalted canned beans. Lean cuts of beef with fat trimmed off. Low-sodium, lean deli meat. Dairy  Low-fat (1%) or fat-free (skim) milk. Fat-free, low-fat, or reduced-fat cheeses. Nonfat, low-sodium ricotta or cottage cheese. Low-fat or nonfat yogurt. Low-fat, low-sodium cheese. Fats and oils  Soft margarine without trans fats. Vegetable oil. Low-fat, reduced-fat, or light mayonnaise and salad dressings (reduced-sodium). Canola, safflower, olive, soybean, and sunflower oils. Avocado. Seasoning and other foods  Herbs. Spices. Seasoning mixes without salt. Unsalted popcorn and pretzels. Fat-free sweets. What foods are not recommended? The items listed may not be a complete list. Talk with your dietitian about what dietary choices are best for you. Grains  Baked goods made with fat, such as croissants, muffins, or some breads. Dry pasta or rice meal packs. Vegetables  Creamed or fried vegetables. Vegetables in a cheese sauce. Regular canned vegetables (not low-sodium or reduced-sodium). Regular canned tomato sauce and paste (not low-sodium or reduced-sodium). Regular tomato and vegetable juice (not low-sodium or reduced-sodium). Angie Fava. Olives. Fruits  Canned fruit in a light or heavy syrup. Fried fruit. Fruit in cream or butter sauce. Meat and other protein foods  Fatty cuts of meat. Ribs. Fried meat. Berniece Salines. Sausage. Bologna and other processed lunch meats. Salami. Fatback. Hotdogs. Bratwurst. Salted nuts and seeds. Canned beans with added salt. Canned or smoked fish. Whole eggs or egg yolks. Chicken or [place] with skin. Dairy  Whole or 2% milk, cream, and half-and-half. Whole or full-fat cream cheese. Whole-fat or sweetened yogurt. Full-fat cheese. Nondairy creamers.  Whipped toppings. Processed cheese and cheese spreads. Fats and oils  Butter. Stick margarine. Lard. Shortening. Ghee. Bacon fat. Tropical oils, such as coconut, palm kernel, or palm oil. Seasoning and other foods  Salted popcorn and pretzels. Onion salt, garlic salt, seasoned salt, table salt, and sea salt. Worcestershire sauce. Tartar sauce. Barbecue sauce. Teriyaki sauce. Soy sauce, including reduced-sodium. Steak sauce. Canned and packaged gravies. Fish sauce. Oyster sauce. Cocktail sauce. Horseradish that you find on the shelf. Ketchup. Mustard. Meat flavorings and tenderizers. Bouillon cubes. Hot sauce and Tabasco sauce. Premade or packaged marinades. Premade or packaged taco seasonings. Relishes. Regular salad dressings. Where to find more information:  National Heart, Lung, and [place]: https://wilson-eaton.com/  American Heart Association: www.heart.org Summary  The DASH eating plan is a healthy eating plan that has been shown to reduce high blood pressure (hypertension). It may also reduce your risk for type 2 diabetes, heart disease, and stroke.  With the DASH eating plan, you should limit salt (sodium) intake to 2,300 mg a day. If you have hypertension, you may need to reduce your sodium intake to 1,500 mg a day.  When on the DASH eating plan, aim to eat more fresh fruits and vegetables, whole grains, lean proteins, low-fat dairy, and heart-healthy fats.  Work  with your health care provider or diet and nutrition specialist (dietitian) to adjust your eating plan to your individual calorie needs. This information is not intended to replace advice given to you by your health care provider. Make sure you discuss any questions you have with your health care provider. Document Released: 08/13/2011 Document Revised: 08/17/2016 Document Reviewed: 08/17/2016 Elsevier Interactive Patient Education  2017 Else[employer] Inc. Hypertension Hypertension, commonly called high blood pressure, is when  the force of blood pumping through the arteries is too strong. The arteries are the blood vessels that carry blood from the heart throughout the body. Hypertension forces the heart to work harder to pump blood and may cause arteries to become narrow or stiff. Having untreated or uncontrolled hypertension can cause heart attacks, strokes, kidney disease, and other problems. A blood pressure reading consists of a higher number over a lower number. Ideally, your blood pressure should be below 120/80. The first ("top") number is called the systolic pressure. It is a measure of the pressure in your arteries as your heart beats. The second ("bottom") number is called the diastolic pressure. It is a measure of the pressure in your arteries as the heart relaxes. What are the causes? The cause of this condition is not known. What increases the risk? Some risk factors for high blood pressure are under your control. Others are not. Factors you can change   Smoking.  Having type 2 diabetes mellitus, high cholesterol, or both.  Not getting enough exercise or physical activity.  Being overweight.  Having too much fat, sugar, calories, or salt (sodium) in your diet.  Drinking too much alcohol. Factors that are difficult or impossible to change   Having chronic kidney disease.  Having a family history of high blood pressure.  Age. Risk increases with age.  Race. You may be at higher risk if you are African-American.  Gender. Men are at higher risk than women before age 45. After age 65, women are at higher risk than men.  Having obstructive sleep apnea.  Stress. What are the signs or symptoms? Extremely high blood pressure (hypertensive crisis) may cause:  Headache.  Anxiety.  Shortness of breath.  Nosebleed.  Nausea and vomiting.  Severe chest pain.  Jerky movements you cannot control (seizures). How is this diagnosed? This condition is diagnosed by measuring your blood pressure  while you are seated, with your arm resting on a surface. The cuff of the blood pressure monitor will be placed directly against the skin of your upper arm at the level of your heart. It should be measured at least twice using the same arm. Certain conditions can cause a difference in blood pressure between your right and left arms. Certain factors can cause blood pressure readings to be lower or higher than normal (elevated) for a short period of time:  When your blood pressure is higher when you are in a health care provider's office than when you are at home, this is called white coat hypertension. Most people with this condition do not need medicines.  When your blood pressure is higher at home than when you are in a health care provider's office, this is called masked hypertension. Most people with this condition may need medicines to control blood pressure. If you have a high blood pressure reading during one visit or you have normal blood pressure with other risk factors:  You may be asked to return on a different day to have your blood pressure checked again.  You may   be asked to monitor your blood pressure at home for 1 week or longer. If you are diagnosed with hypertension, you may have other blood or imaging tests to help your health care provider understand your overall risk for other conditions. How is this treated? This condition is treated by making healthy lifestyle changes, such as eating healthy foods, exercising more, and reducing your alcohol intake. Your health care provider may prescribe medicine if lifestyle changes are not enough to get your blood pressure under control, and if:  Your systolic blood pressure is above 130.  Your diastolic blood pressure is above 80. Your personal target blood pressure may vary depending on your medical conditions, your age, and other factors. Follow these instructions at home: Eating and drinking   Eat a diet that is high in fiber and  potassium, and low in sodium, added sugar, and fat. An example eating plan is called the DASH (Dietary Approaches to Stop Hypertension) diet. To eat this way:  Eat plenty of fresh fruits and vegetables. Try to fill half of your plate at each meal with fruits and vegetables.  Eat whole grains, such as whole wheat pasta, brown rice, or whole grain bread. Fill about one quarter of your plate with whole grains.  Eat or drink low-fat dairy products, such as skim milk or low-fat yogurt.  Avoid fatty cuts of meat, processed or cured meats, and poultry with skin. Fill about one quarter of your plate with lean proteins, such as fish, chicken without skin, beans, eggs, and tofu.  Avoid premade and processed foods. These tend to be higher in sodium, added sugar, and fat.  Reduce your daily sodium intake. Most people with hypertension should eat less than 1,500 mg of sodium a day.  Limit alcohol intake to no more than 1 drink a day for nonpregnant women and 2 drinks a day for men. One drink equals 12 oz of beer, 5 oz of wine, or 1 oz of hard liquor. Lifestyle   Work with your health care provider to maintain a healthy body weight or to lose weight. Ask what an ideal weight is for you.  Get at least 30 minutes of exercise that causes your heart to beat faster (aerobic exercise) most days of the week. Activities may include walking, swimming, or biking.  Include exercise to strengthen your muscles (resistance exercise), such as pilates or lifting weights, as part of your weekly exercise routine. Try to do these types of exercises for 30 minutes at least 3 days a week.  Do not use any products that contain nicotine or tobacco, such as cigarettes and e-cigarettes. If you need help quitting, ask your health care provider.  Monitor your blood pressure at home as told by your health care provider.  Keep all follow-up visits as told by your health care provider. This is important. Medicines   Take  over-the-counter and prescription medicines only as told by your health care provider. Follow directions carefully. Blood pressure medicines must be taken as prescribed.  Do not skip doses of blood pressure medicine. Doing this puts you at risk for problems and can make the medicine less effective.  Ask your health care provider about side effects or reactions to medicines that you should watch for. Contact a health care provider if:  You think you are having a reaction to a medicine you are taking.  You have headaches that keep coming back (recurring).  You feel dizzy.  You have swelling in your ankles.  You  have trouble with your vision. Get help right away if:  You develop a severe headache or confusion.  You have unusual weakness or numbness.  You feel faint.  You have severe pain in your chest or abdomen.  You vomit repeatedly.  You have trouble breathing. Summary  Hypertension is when the force of blood pumping through your arteries is too strong. If this condition is not controlled, it may put you at risk for serious complications.  Your personal target blood pressure may vary depending on your medical conditions, your age, and other factors. For most people, a normal blood pressure is less than 120/80.  Hypertension is treated with lifestyle changes, medicines, or a combination of both. Lifestyle changes include weight loss, eating a healthy, low-sodium diet, exercising more, and limiting alcohol. This information is not intended to replace advice given to you by your health care provider. Make sure you discuss any questions you have with your health care provider. Document Released: 08/24/2005 Document Revised: 07/22/2016 Document Reviewed: 07/22/2016 Elsevier Interactive Patient Education  2017 [employer].

## 2017-01-27 NOTE — Progress Notes (Signed)
Patient is here for FU  Patient denies pain at this time.  Patient has taken medication today. Patient has eaten today.   

## 2017-01-27 NOTE — Progress Notes (Signed)
Kylie Harris, is a 61 y.o. female  TIW:[id number]  SNK:[id number]  DOB - 06/27/1956  Chief Complaint  Patient presents with  . Hypertension  . Dizziness       Subjective:   Kylie Harris is a 61 y.o. female with history of hypertension here today for a follow up visit and medication refills. She was seen at the emergency department in April for an episode of dizziness. She has a history of vertigo a few years ago and states this feels similar. She denies HA, CP, SOB, numbness, paresthesia, vision changes. Her evaluations were unremarkable. She was managed in the ED and discharged to be followed up by PCP. Patient claims to be doing better, no new episode of vertigo or syncope. She needs refill on her medications today. Patient has No headache, No chest pain, No abdominal pain - No Nausea, No new weakness tingling or numbness, No Cough - SOB. 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  No problems updated.  ALLERGIES: No Known Allergies  PAST MEDICAL HISTORY: Past Medical History:  Diagnosis Date  . Cataract    left eye  . Detached retina    right  . Glaucoma   . Hypertension    takes Metoprolol,Micardis,Amlodipine,and CLonidine daily  . Vertigo     MEDICATIONS AT HOME: Prior to Admission medications   Medication Sig Start Date End Date Taking? Authorizing Provider  amLODipine (NORVASC) 10 MG tablet Take 1 tablet (10 mg total) by mouth daily. 01/27/17  Yes Tresa Garter, MD  ibuprofen (ADVIL,MOTRIN) 200 MG tablet Take 400 mg by mouth every 6 (six) hours as needed for headache or moderate pain.    Yes [provider]  losartan-hydrochlorothiazide (HYZAAR) 50-12.5 MG tablet Take 1 tablet by mouth daily. 01/27/17  Yes Tresa Garter, MD  metoprolol succinate (TOPROL-XL) 50 MG 24 hr tablet Take 2 tablets (100 mg total) by mouth daily. 01/27/17  Yes Tresa Garter, MD  vitamin C (ASCORBIC ACID) 500 MG tablet Take 500 mg by  mouth daily.   Yes [provider]  meclizine (ANTIVERT) 25 MG tablet Take 1 tablet (25 mg total) by mouth 3 (three) times daily as needed for dizziness. Patient not taking: Reported on 01/27/2017 12/15/16   Virgel Manifold, MD  terbinafine (LAMISIL) 250 MG tablet Take 1 tablet (250 mg total) by mouth daily. 01/27/17   Tresa Garter, MD    Objective:   Vitals:   01/27/17 1126  BP: 119/61  Pulse: 73  Resp: 18  Temp: 98.3 F (36.8 C)  TempSrc: Oral  SpO2: 97%  Weight: 167 lb (75.8 kg)  Height: 5\' 1"  (1.549 m)   Exam General appearance : Awake, alert, not in any distress. Speech Clear. Not toxic looking HEENT: Atraumatic and Normocephalic, pupils equally reactive to light and accomodation Neck: Supple, no JVD. No cervical lymphadenopathy.  Chest: Good air entry bilaterally, no added sounds  CVS: S1 S2 regular, no murmurs.  Abdomen: Bowel sounds present, Non tender and not distended with no gaurding, rigidity or rebound. Extremities: B/L Lower Ext shows no edema, both legs are warm to touch Neurology: Awake alert, and oriented X 3, CN II-XII intact, Non focal Skin: No Rash  Data Review Lab Results  Component Value Date   HGBA1C 5.6 03/01/2014   HGBA1C 9.0 07/17/2013    Assessment & Plan   1. Essential hypertension  - amLODipine (NORVASC) 10 MG tablet; Take 1 tablet (10 mg total) by mouth  daily.  Dispense: 90 tablet; Refill: 3 - losartan-hydrochlorothiazide (HYZAAR) 50-12.5 MG tablet; Take 1 tablet by mouth daily.  Dispense: 90 tablet; Refill: 3 - metoprolol succinate (TOPROL-XL) 50 MG 24 hr tablet; Take 2 tablets (100 mg total) by mouth daily.  Dispense: 180 tablet; Refill: 3  2. Onychomycosis  - terbinafine (LAMISIL) 250 MG tablet; Take 1 tablet (250 mg total) by mouth daily.  Dispense: 30 tablet; Refill: 3  Patient have been counseled extensively about nutrition and exercise. Other issues discussed during this visit include: low cholesterol diet, weight  control and daily exercise, importance of adherence with medications and regular follow-up. We also discussed long term complications of uncontrolled hypertension.   Return in about 6 months (around 07/30/2017) for Follow up HTN.  The patient was given clear instructions to go to ER or return to medical center if symptoms don't improve, worsen or new problems develop. The patient verbalized understanding. The patient was told to call to get lab results if they haven't heard anything in the next week.   This note has been created with Surveyor, quantity. Any transcriptional errors are unintentional.    Angelica Chessman, MD, [place], Karilyn C[place], [place] [facility] [place], [place]   01/27/2017, 11:56 AM

## 2017-02-02 ENCOUNTER — Ambulatory Visit: Payer: Self-pay | Attending: Internal Medicine

## 2017-02-02 MED FILL — TERBINAFINE HCL 250 MG TAB: 250 | 30 days supply | Qty: 30 | Fill #6

## 2017-02-02 MED FILL — LOSARTAN-HCTZ 50-12.5 MG TA: 50-12.5 | 30 days supply | Qty: 30 | Fill #6

## 2017-02-02 MED FILL — ?METOPROLOL SUCC ER 50 MG: 50 MG | 30 days supply | Qty: 60 | Fill #6

## 2017-02-02 MED FILL — ?AMLODIPINE BESYLATE 10 MG: 10 | 30 days supply | Qty: 30 | Fill #6

## 2017-03-12 MED FILL — TERBINAFINE HCL 250 MG TAB: 250 | 30 days supply | Qty: 30 | Fill #0

## 2017-03-12 MED FILL — ?METOPROLOL SUCC ER 50 MG: 50 MG | 30 days supply | Qty: 60 | Fill #0

## 2017-03-12 MED FILL — ?AMLODIPINE BESYLATE 10 MG: 10 | 30 days supply | Qty: 30 | Fill #0

## 2017-03-12 MED FILL — ?LOSARTAN-HCTZ 50-12.5 MG T: 50-12.5 | 30 days supply | Qty: 30 | Fill #0

## 2017-03-23 ENCOUNTER — Ambulatory Visit: Payer: Self-pay | Attending: Internal Medicine

## 2017-04-21 MED FILL — TERBINAFINE HCL 250 MG TAB: 250 | 30 days supply | Qty: 30 | Fill #1

## 2017-04-22 MED FILL — AMLODIPINE BESYLATE 10 MG T: 10 | 30 days supply | Qty: 30 | Fill #1

## 2017-04-22 MED FILL — LOSARTAN-HCTZ 50-12.5 MG TA: 50-12.5 | 30 days supply | Qty: 30 | Fill #1

## 2017-04-22 MED FILL — METOPROLOL SUCC ER 50 MG TA: 50 | 30 days supply | Qty: 60 | Fill #1

## 2017-05-31 MED FILL — TERBINAFINE HCL 250 MG TAB: 250 | 30 days supply | Qty: 30 | Fill #2

## 2017-05-31 MED FILL — METOPROLOL SUCC ER 50 MG TA: 50 | 30 days supply | Qty: 60 | Fill #2

## 2017-05-31 MED FILL — AMLODIPINE BESYLATE 10 MG T: 10 | 30 days supply | Qty: 30 | Fill #2

## 2017-05-31 MED FILL — LOSARTAN-HCTZ 50-12.5 MG TA: 50-12.5 | 30 days supply | Qty: 30 | Fill #2

## 2017-07-19 MED FILL — AMLODIPINE BESYLATE 10 MG T: 10 | 30 days supply | Qty: 30 | Fill #3

## 2017-07-19 MED FILL — METOPROLOL SUCC ER 50 MG TA: 50 | 30 days supply | Qty: 60 | Fill #3

## 2017-07-19 MED FILL — TERBINAFINE HCL 250 MG TAB: 250 | 30 days supply | Qty: 30 | Fill #3

## 2017-07-19 MED FILL — LOSARTAN-HCTZ 50-12.5 MG TA: 50-12.5 | 30 days supply | Qty: 30 | Fill #3

## 2017-09-03 MED FILL — LOSARTAN-HCTZ 50-12.5 MG TA: 50-12.5 | 30 days supply | Qty: 30 | Fill #4

## 2017-09-03 MED FILL — AMLODIPINE BESYLATE 10 MG T: 10 | 30 days supply | Qty: 30 | Fill #4

## 2017-09-03 MED FILL — METOPROLOL SUCC ER 50 MG TA: 50 | 30 days supply | Qty: 60 | Fill #4

## 2017-09-15 ENCOUNTER — Ambulatory Visit: Payer: Medicaid Other | Attending: Internal Medicine

## 2017-10-25 MED FILL — METOPROLOL SUCCINATE ER 50: 50 | 30 days supply | Qty: 60 | Fill #5

## 2017-10-25 MED FILL — LOSARTAN-HCTZ 50-12.5 MG TA: 50-12.5 | 30 days supply | Qty: 30 | Fill #5

## 2017-10-25 MED FILL — AMLODIPINE BESYLATE 10 MG T: 10 | 30 days supply | Qty: 30 | Fill #5

## 2017-12-15 MED FILL — AMLODIPINE BESYLATE 10 MG T: 10 | 30 days supply | Qty: 30 | Fill #6

## 2017-12-15 MED FILL — METOPROLOL SUCCINATE ER 50: 50 | 30 days supply | Qty: 60 | Fill #6

## 2017-12-15 MED FILL — LOSARTAN-HCTZ 50-12.5 MG TA: 50-12.5 | 30 days supply | Qty: 30 | Fill #6

## 2018-03-08 ENCOUNTER — Encounter

## 2018-03-09 ENCOUNTER — Ambulatory Visit: Payer: Medicaid Other | Attending: Family Medicine | Admitting: Physician Assistant

## 2018-03-09 VITALS — BP 193/92 | HR 64 | Temp 98.2°F | Resp 18 | Ht 62.0 in | Wt 168.0 lb

## 2018-03-09 DIAGNOSIS — H409 Unspecified glaucoma: Secondary | ICD-10-CM | POA: Insufficient documentation

## 2018-03-09 DIAGNOSIS — H3321 Serous retinal detachment, right eye: Secondary | ICD-10-CM | POA: Insufficient documentation

## 2018-03-09 DIAGNOSIS — M62838 Other muscle spasm: Secondary | ICD-10-CM | POA: Diagnosis not present

## 2018-03-09 DIAGNOSIS — Z79899 Other long term (current) drug therapy: Secondary | ICD-10-CM | POA: Insufficient documentation

## 2018-03-09 DIAGNOSIS — H269 Unspecified cataract: Secondary | ICD-10-CM | POA: Insufficient documentation

## 2018-03-09 DIAGNOSIS — Z760 Encounter for issue of repeat prescription: Secondary | ICD-10-CM | POA: Diagnosis present

## 2018-03-09 DIAGNOSIS — I10 Essential (primary) hypertension: Secondary | ICD-10-CM | POA: Diagnosis present

## 2018-03-09 MED ORDER — METOPROLOL SUCCINATE ER 50 MG PO TB24
100.0000 mg | ORAL_TABLET | Freq: Every day | ORAL | 3 refills | Status: AC
Start: 2018-03-09 — End: ?

## 2018-03-09 MED ORDER — LOSARTAN POTASSIUM-HCTZ 50-12.5 MG PO TABS: 1.0000 | ORAL_TABLET | Freq: Every day | ORAL | 3 refills | Status: DC

## 2018-03-09 MED ORDER — METHOCARBAMOL 500 MG PO TABS: 500.0000 mg | ORAL_TABLET | Freq: Three times a day (TID) | ORAL | 0 refills | Status: DC | PRN

## 2018-03-09 MED ORDER — AMLODIPINE BESYLATE 10 MG PO TABS: 10.0000 mg | ORAL_TABLET | Freq: Every day | ORAL | 3 refills | Status: AC

## 2018-03-09 MED FILL — AMLODIPINE BESYLATE 10 MG T: 10 | 90 days supply | Qty: 90 | Fill #0

## 2018-03-09 MED FILL — LOSARTAN-HCTZ 50-12.5 MG TA: 50-12.5 | 90 days supply | Qty: 90 | Fill #0

## 2018-03-09 MED FILL — METOPROLOL SUCCINATE ER 50: 50 | 90 days supply | Qty: 180 | Fill #0

## 2018-03-09 MED FILL — METHOCARBAMOL 500 MG TABS: 500 | 30 days supply | Qty: 90 | Fill #0

## 2018-03-09 NOTE — Progress Notes (Signed)
Patient ID: Kylie Harris, female   DOB: 1955-12-18, 62 y.o.   MRN: [id number]   Kylie Harris, is a 62 y.o. female  TFT:[id number]  HCW:[id number]  DOB - Mar 12, 1956  Subjective:  Chief Complaint and HPI: Kylie Harris is a 62 y.o. female here today for RF of BP meds.  Out of meds now for over a month.  No HA/dizziness/CP.    Also c/o pulled muscle and TTP in Upper L trapezius area for about 3 weeks.  NKI but she did do some cleaning a few weeks ago.  No paresthesias/radiculopathy.  No UEW. R hand dominant.    ROS:   Constitutional:  No f/c, No night sweats, No unexplained weight loss. EENT:  No vision changes, No blurry vision, No hearing changes. No mouth, throat, or ear problems.  Respiratory: No cough, No SOB Cardiac: No CP, no palpitations GI:  No abd pain, No N/V/D. GU: No Urinary s/sx Musculoskeletal: No joint pain Neuro: No headache, no dizziness, no motor weakness.  Skin: No rash Endocrine:  No polydipsia. No polyuria.  Psych: Denies SI/HI  No problems updated.  ALLERGIES: No Known Allergies  PAST MEDICAL HISTORY: Past Medical History:  Diagnosis Date  . Cataract    left eye  . Detached retina    right  . Glaucoma   . Hypertension    takes Metoprolol,Micardis,Amlodipine,and CLonidine daily  . Vertigo     MEDICATIONS AT HOME: Prior to Admission medications   Medication Sig Start Date End Date Taking? Authorizing Provider  amLODipine (NORVASC) 10 MG tablet Take 1 tablet (10 mg total) by mouth daily. 03/09/18  Yes Bryann Mcnealy M, PA-C  ibuprofen (ADVIL,MOTRIN) 200 MG tablet Take 400 mg by mouth every 6 (six) hours as needed for headache or moderate pain.    Yes [provider]  losartan-hydrochlorothiazide (HYZAAR) 50-12.5 MG tablet Take 1 tablet by mouth daily. 03/09/18  Yes Georgian Co M, PA-C  metoprolol succinate (TOPROL-XL) 50 MG 24 hr tablet Take 2 tablets (100 mg total) by mouth daily. 03/09/18  Yes Georgian Co M, PA-C  terbinafine  (LAMISIL) 250 MG tablet Take 1 tablet (250 mg total) by mouth daily. 01/27/17  Yes Quentin Angst, MD  vitamin C (ASCORBIC ACID) 500 MG tablet Take 500 mg by mouth daily.   Yes [provider]  meclizine (ANTIVERT) 25 MG tablet Take 1 tablet (25 mg total) by mouth 3 (three) times daily as needed for dizziness. Patient not taking: Reported on 01/27/2017 12/15/16   Raeford Razor, MD  methocarbamol (ROBAXIN) 500 MG tablet Take 1 tablet (500 mg total) by mouth every 8 (eight) hours as needed for muscle spasms. 03/09/18   Anders Simmonds, PA-C     Objective:  EXAM:   Vitals:   03/09/18 0956  BP: (!) 193/92  Pulse: 64  Resp: 18  Temp: 98.2 F (36.8 C)  TempSrc: Oral  SpO2: 100%  Weight: 168 lb (76.2 kg)  Height: 5\' 2"  (1.575 m)    General appearance : A&OX3. NAD. Non-toxic-appearing HEENT: Atraumatic and Normocephalic.  PERRLA. EOM intact.   Neck: supple, no JVD. No cervical lymphadenopathy. No thyromegaly Chest/Lungs:  Breathing-non-labored, Good air entry bilaterally, breath sounds normal without rales, rhonchi, or wheezing  CVS: S1 S2 regular, no murmurs, gallops, rubs  No c-spine/t-spine TTP.  +trapezius spasm on the L.  UE DTR=B. R/LUE=S&ROM Extremities: Bilateral Lower Ext shows no edema, both legs are warm to touch with = pulse throughout Neurology:  CN II-XII grossly intact,  Non focal.   Psych:  TP linear. J/I WNL. Normal speech. Appropriate eye contact and affect.  Skin:  No Rash  Data Review Lab Results  Component Value Date   HGBA1C 5.6 01/27/2017   HGBA1C 5.6 03/01/2014   HGBA1C 9.0 07/17/2013     Assessment & Plan   1. Uncontrolled hypertension Resume meds.  Compliance stressed and imperative. Check blood pressure out of the office 3-5 times/week and record and bring to next visit.   - Comprehensive metabolic panel - CBC with Differential/Platelet - amLODipine (NORVASC) 10 MG tablet; Take 1 tablet (10 mg total) by mouth daily.  Dispense: 90  tablet; Refill: 3 - losartan-hydrochlorothiazide (HYZAAR) 50-12.5 MG tablet; Take 1 tablet by mouth daily.  Dispense: 90 tablet; Refill: 3 - metoprolol succinate (TOPROL-XL) 50 MG 24 hr tablet; Take 2 tablets (100 mg total) by mouth daily.  Dispense: 180 tablet; Refill: 3  2. Essential hypertension resume meds.Check blood pressure out of the office 3-5 times/week and record and bring to next visit.   - amLODipine (NORVASC) 10 MG tablet; Take 1 tablet (10 mg total) by mouth daily.  Dispense: 90 tablet; Refill: 3 - losartan-hydrochlorothiazide (HYZAAR) 50-12.5 MG tablet; Take 1 tablet by mouth daily.  Dispense: 90 tablet; Refill: 3 - metoprolol succinate (TOPROL-XL) 50 MG 24 hr tablet; Take 2 tablets (100 mg total) by mouth daily.  Dispense: 180 tablet; Refill: 3  3. Muscle spasm Ice/heat to trapezius - methocarbamol (ROBAXIN) 500 MG tablet; Take 1 tablet (500 mg total) by mouth every 8 (eight) hours as needed for muscle spasms.  Dispense: 90 tablet; Refill: 0     Patient have been counseled extensively about nutrition and exercise  Return for keep f/up appt at end of month.  The patient was given clear instructions to go to ER or return to medical center if symptoms don't improve, worsen or new problems develop. The patient verbalized understanding. The patient was told to call to get lab results if they haven't heard anything in the next week.     Georgian Co, PA-C [facility] [place], [place] [phone]   03/09/2018, 10:10 AM

## 2018-03-09 NOTE — Patient Instructions (Addendum)
Check blood pressure out of the office 3-5 times/ week and record and bring to next visit 

## 2018-03-10 LAB — COMPREHENSIVE METABOLIC PANEL
ALBUMIN: 4.5 g/dL (ref 3.6–4.8)
ALK PHOS: 63 IU/L (ref 39–117)
ALT: 25 IU/L (ref 0–32)
AST: 18 IU/L (ref 0–40)
Albumin/Globulin Ratio: 2 (ref 1.2–2.2)
BUN / CREAT RATIO: 16 (ref 12–28)
BUN: 9 mg/dL (ref 8–27)
Bilirubin Total: <0.2 mg/dL (ref 0.0–1.2)
CO2: 26 mmol/L (ref 20–29)
CREATININE: 0.56 mg/dL — AB (ref 0.57–1.00)
Calcium: 9.6 mg/dL (ref 8.7–10.3)
Chloride: 101 mmol/L (ref 96–106)
GFR calc Af Amer: 117 mL/min/{1.73_m2} (ref >59)
GFR, EST NON AFRICAN AMERICAN: 102 mL/min/{1.73_m2} (ref >59)
Globulin, Total: 2.3 g/dL (ref 1.5–4.5)
Glucose: 82 mg/dL (ref 65–99)
Potassium: 3.4 mmol/L — ABNORMAL LOW (ref 3.5–5.2)
SODIUM: 141 mmol/L (ref 134–144)
Total Protein: 6.8 g/dL (ref 6.0–8.5)

## 2018-03-10 LAB — CBC WITH DIFFERENTIAL/PLATELET
BASOS: 0 %
Basophils Absolute: 0 10*3/uL (ref 0.0–0.2)
EOS (ABSOLUTE): 0 10*3/uL (ref 0.0–0.4)
EOS: 1 %
HEMATOCRIT: 38.7 % (ref 34.0–46.6)
Hemoglobin: 12.6 g/dL (ref 11.1–15.9)
Immature Grans (Abs): 0 10*3/uL (ref 0.0–0.1)
Immature Granulocytes: 0 %
LYMPHS ABS: 2.5 10*3/uL (ref 0.7–3.1)
Lymphs: 41 %
MCH: 26.8 pg (ref 26.6–33.0)
MCHC: 32.6 g/dL (ref 31.5–35.7)
MCV: 82 fL (ref 79–97)
MONOS ABS: 0.5 10*3/uL (ref 0.1–0.9)
Monocytes: 8 %
Neutrophils Absolute: 3.1 10*3/uL (ref 1.4–7.0)
Neutrophils: 50 %
Platelets: 453 10*3/uL — ABNORMAL HIGH (ref 150–450)
RBC: 4.71 x10E6/uL (ref 3.77–5.28)
RDW: 13.7 % (ref 12.3–15.4)
WBC: 6.1 10*3/uL (ref 3.4–10.8)

## 2018-03-11 ENCOUNTER — Telehealth: Payer: Self-pay | Admitting: *Deleted

## 2018-03-11 NOTE — Telephone Encounter (Signed)
-----   Message from [place] Donovan, [place] sent at 03/10/2018  8:24 AM EDT ----- Please call patient.  Overall her labs look good.  Her potassium is just a little low.  She could eat spinach, tomato, avacado, banana, or orange juice.  One serving daily of any of these would bring this to a normal level.  Follow-up as planned. Thanks, Freeman Caldron, PA-C

## 2018-03-11 NOTE — Telephone Encounter (Signed)
Patient verified DOB Patient is aware of labs being good and needing to implement some foods with potassium to help bring this level in normal range. No further questions.

## 2018-03-29 ENCOUNTER — Ambulatory Visit: Payer: Medicaid Other | Admitting: Nurse Practitioner

## 2018-05-27 ENCOUNTER — Other Ambulatory Visit: Payer: Self-pay | Admitting: Internal Medicine

## 2018-05-27 DIAGNOSIS — E2839 Other primary ovarian failure: Secondary | ICD-10-CM

## 2018-05-30 ENCOUNTER — Other Ambulatory Visit: Payer: Self-pay | Admitting: Internal Medicine

## 2018-05-30 DIAGNOSIS — Z1231 Encounter for screening mammogram for malignant neoplasm of breast: Secondary | ICD-10-CM

## 2018-06-02 ENCOUNTER — Encounter: Payer: Self-pay | Admitting: Gastroenterology

## 2018-06-28 MED FILL — METOPROLOL SUCCINATE ER 50: 50 | 90 days supply | Qty: 180 | Fill #1

## 2018-06-28 MED FILL — LOSARTAN-HCTZ 50-12.5 MG TA: 50-12.5 | 90 days supply | Qty: 90 | Fill #1

## 2018-06-28 MED FILL — AMLODIPINE BESYLATE 10 MG T: 10 | 90 days supply | Qty: 90 | Fill #1

## 2018-07-07 ENCOUNTER — Telehealth: Payer: Self-pay

## 2018-07-07 NOTE — Telephone Encounter (Signed)
Left message that her pre visit is at 330 on 11/8 not 4 pm  Which is what letter sent to her indicated

## 2018-07-15 ENCOUNTER — Ambulatory Visit (AMBULATORY_SURGERY_CENTER): Payer: Self-pay | Admitting: *Deleted

## 2018-07-15 VITALS — Ht 62.0 in | Wt 165.0 lb

## 2018-07-15 DIAGNOSIS — Z1211 Encounter for screening for malignant neoplasm of colon: Secondary | ICD-10-CM

## 2018-07-15 MED ORDER — NA SULFATE-K SULFATE-MG SULF 17.5-3.13-1.6 GM/177ML PO SOLN: 1.0000 | Freq: Once | ORAL | 0 refills | Status: AC

## 2018-07-15 NOTE — Progress Notes (Signed)
No egg or soy allergy known to patient  No issues with past sedation with any surgeries  or procedures, no intubation problems  No diet pills per patient No home 02 use per patient  No blood thinners per patient  Pt denies issues with constipation  No A fib or A flutter  EMMI video sent to pt's e mail  

## 2018-07-22 ENCOUNTER — Ambulatory Visit
Admission: RE | Admit: 2018-07-22 | Discharge: 2018-07-22 | Disposition: A | Discharge status: Home / Self Care | Payer: Medicaid Other | Source: Ambulatory Visit | Attending: Internal Medicine | Admitting: Internal Medicine

## 2018-07-22 DIAGNOSIS — Z1231 Encounter for screening mammogram for malignant neoplasm of breast: Secondary | ICD-10-CM

## 2018-07-22 DIAGNOSIS — E2839 Other primary ovarian failure: Secondary | ICD-10-CM

## 2018-07-29 ENCOUNTER — Ambulatory Visit (AMBULATORY_SURGERY_CENTER): Payer: Medicaid Other | Admitting: Gastroenterology

## 2018-07-29 ENCOUNTER — Encounter: Payer: Medicaid Other | Admitting: Gastroenterology

## 2018-07-29 ENCOUNTER — Encounter: Payer: Self-pay | Admitting: Gastroenterology

## 2018-07-29 VITALS — BP 141/69 | HR 45 | Temp 96.2°F | Resp 19 | Ht 62.0 in | Wt 165.0 lb

## 2018-07-29 DIAGNOSIS — Z1211 Encounter for screening for malignant neoplasm of colon: Secondary | ICD-10-CM | POA: Diagnosis not present

## 2018-07-29 DIAGNOSIS — D122 Benign neoplasm of ascending colon: Secondary | ICD-10-CM

## 2018-07-29 MED ORDER — SODIUM CHLORIDE 0.9 % IV SOLN: 500.0000 mL | Freq: Once | INTRAVENOUS | Status: DC

## 2018-07-29 NOTE — Progress Notes (Signed)
Called to room to assist during endoscopic procedure.  Patient ID and intended procedure confirmed with present staff. Received instructions for my participation in the procedure from the performing physician.  

## 2018-07-29 NOTE — Progress Notes (Signed)
A and O x3. Report to RN. Tolerated MAC anesthesia well.

## 2018-07-29 NOTE — Progress Notes (Signed)
Pt's states no medical or surgical changes since previsit or office visit. 

## 2018-07-29 NOTE — Patient Instructions (Signed)
Handouts given on polyp and hemorrhoids. One polyp removed today. Please start taking Miralax everyday or every other day.   YOU HAD AN ENDOSCOPIC PROCEDURE TODAY AT [facility]:   Refer to the procedure report that was given to you for any specific questions about what was found during the examination.  If the procedure report does not answer your questions, please call your gastroenterologist to clarify.  If you requested that your care partner not be given the details of your procedure findings, then the procedure report has been included in a sealed envelope for you to review at your convenience later.  YOU SHOULD EXPECT: Some feelings of bloating in the abdomen. Passage of more gas than usual.  Walking can help get rid of the air that was put into your GI tract during the procedure and reduce the bloating. If you had a lower endoscopy (such as a colonoscopy or flexible sigmoidoscopy) you may notice spotting of blood in your stool or on the toilet paper. If you underwent a bowel prep for your procedure, you may not have a normal bowel movement for a few days.  Please Note:  You might notice some irritation and congestion in your nose or some drainage.  This is from the oxygen used during your procedure.  There is no need for concern and it should clear up in a day or so.  SYMPTOMS TO REPORT IMMEDIATELY:   Following lower endoscopy (colonoscopy or flexible sigmoidoscopy):  Excessive amounts of blood in the stool  Significant tenderness or worsening of abdominal pains  Swelling of the abdomen that is new, acute  Fever of 100F or higher  For urgent or emergent issues, a gastroenterologist can be reached at any hour by calling [phone].   DIET:  We do recommend a small meal at first, but then you may proceed to your regular diet.  Drink plenty of fluids but you should avoid alcoholic beverages for 24 hours.  ACTIVITY:  You should plan to take it easy for the rest of  today and you should NOT DRIVE or use heavy machinery until tomorrow (because of the sedation medicines used during the test).    FOLLOW UP: Our staff will call the number listed on your records the next business day following your procedure to check on you and address any questions or concerns that you may have regarding the information given to you following your procedure. If we do not reach you, we will leave a message.  However, if you are feeling well and you are not experiencing any problems, there is no need to return our call.  We will assume that you have returned to your regular daily activities without incident.  If any biopsies were taken you will be contacted by phone or by letter within the next 1-3 weeks.  Please call us at [phone] if you have not heard about the biopsies in 3 weeks.    SIGNATURES/CONFIDENTIALITY: You and/or your care partner have signed paperwork which will be entered into your electronic medical record.  These signatures attest to the fact that that the information above on your After Visit Summary has been reviewed and is understood.  Full responsibility of the confidentiality of this discharge information lies with you and/or your care-partner.

## 2018-07-29 NOTE — Op Note (Signed)
[facility] Patient Name: Kylie Harris Procedure Date: 07/29/2018 8:48 AM MRN: [id number] Endoscopist: Justice Britain , MD Age: 62 Referring MD:  Date of Birth: 1955-10-13 Gender: Female Account #: [id number] Procedure:                Colonoscopy Indications:              Screening for colorectal malignant neoplasm Medicines:                Monitored Anesthesia Care Procedure:                Pre-Anesthesia Assessment:                           - Prior to the procedure, a History and Physical                            was performed, and patient medications and                            allergies were reviewed. The patient's tolerance of                            previous anesthesia was also reviewed. The risks                            and benefits of the procedure and the sedation                            options and risks were discussed with the patient.                            All questions were answered, and informed consent                            was obtained. Prior Anticoagulants: The patient has                            taken no previous anticoagulant or antiplatelet                            agents. ASA Grade Assessment: II - A patient with                            mild systemic disease. After reviewing the risks                            and benefits, the patient was deemed in                            satisfactory condition to undergo the procedure.                           After obtaining informed consent, the colonoscope  was passed under direct vision. Throughout the                            procedure, the patient's blood pressure, pulse, and                            oxygen saturations were monitored continuously. The                            Colonoscope was introduced through the anus and                            advanced to the 5 cm into the ileum. The                            colonoscopy was  performed without difficulty. The                            patient tolerated the procedure. The quality of the                            bowel preparation was evaluated using the BBPS                            [facility] Bowel Preparation Scale) with scores of:                            Right Colon = 2 (minor amount of residual staining,                            small fragments of stool and/or opaque liquid, but                            mucosa seen well), Transverse Colon = 3 (entire                            mucosa seen well with no residual staining, small                            fragments of stool or opaque liquid) and Left Colon                            = 3 (entire mucosa seen well with no residual                            staining, small fragments of stool or opaque                            liquid). The total BBPS score equals 8. The quality                            of the bowel preparation was fair. Scope In: 9:07:33  AM Scope Out: 9:26:01 AM Scope Withdrawal Time: 0 hours 13 minutes 26 seconds  Total Procedure Duration: 0 hours 18 minutes 28 seconds  Findings:                 The digital rectal exam findings include                            non-thrombosed internal hemorrhoids. Pertinent                            negatives include no palpable rectal lesions.                           The terminal ileum and ileocecal valve appeared                            normal.                           A 8 mm polyp was found in the ascending colon. The                            polyp was semi-sessile. The polyp was removed with                            a cold snare. Resection and retrieval were complete.                           Normal mucosa was found in the entire colon                            otherwise.                           Non-bleeding non-thrombosed internal hemorrhoids                            were found during retroflexion, during perianal                             exam and during digital exam. The hemorrhoids were                            Grade II (internal hemorrhoids that prolapse but                            reduce spontaneously). Complications:            No immediate complications. Estimated Blood Loss:     Estimated blood loss was minimal. Impression:               - Preparation of the colon was fair.                           - Non-thrombosed internal hemorrhoids found on  digital rectal exam.                           - The examined portion of the ileum was normal.                           - One 8 mm polyp in the ascending colon, removed                            with a cold snare. Resected and retrieved.                           - Normal mucosa in the entire examined colon                            otherwise.                           - Non-bleeding non-thrombosed internal hemorrhoids. Recommendation:           - The patient will be observed post-procedure,                            until all discharge criteria are met.                           - Discharge patient to home.                           - Patient has a contact number available for                            emergencies. The signs and symptoms of potential                            delayed complications were discussed with the                            patient. Return to normal activities tomorrow.                            Written discharge instructions were provided to the                            patient.                           - High fiber diet.                           - Use fiber, for example Citrucel, Fibercon, Konsyl                            or Metamucil at least 1-2 times daily.                           -  Miralax 1 capful (17 grams) in 8 ounces of water                            PO every other day but can increase to daily to                            have at least 1 soft bowel movement daily..                            - Await pathology results.                           - Repeat colonoscopy in 5 years for surveillance.                           - The findings and recommendations were discussed                            with the patient.                           - The findings and recommendations were discussed                            with the designated responsible adult. Justice Britain, MD 07/29/2018 9:31:08 AM

## 2018-08-01 ENCOUNTER — Telehealth: Payer: Self-pay

## 2018-08-01 ENCOUNTER — Telehealth: Payer: Self-pay | Admitting: *Deleted

## 2018-08-01 NOTE — Telephone Encounter (Signed)
Called [phone] and left a messaged we tried to reach pt for a follow up call. maw

## 2018-08-01 NOTE — Telephone Encounter (Signed)
Second attempt, left VM.  

## 2018-08-03 ENCOUNTER — Encounter: Payer: Self-pay | Admitting: Gastroenterology

## 2018-10-18 ENCOUNTER — Emergency Department (HOSPITAL_COMMUNITY)
Admission: EM | Admit: 2018-10-18 | Discharge: 2018-10-18 | Disposition: A | Discharge status: Home / Self Care | Payer: Medicaid Other | Attending: Emergency Medicine | Admitting: Emergency Medicine

## 2018-10-18 ENCOUNTER — Encounter (HOSPITAL_COMMUNITY): Payer: Self-pay

## 2018-10-18 DIAGNOSIS — R112 Nausea with vomiting, unspecified: Secondary | ICD-10-CM | POA: Diagnosis present

## 2018-10-18 DIAGNOSIS — I10 Essential (primary) hypertension: Secondary | ICD-10-CM | POA: Insufficient documentation

## 2018-10-18 DIAGNOSIS — Z79899 Other long term (current) drug therapy: Secondary | ICD-10-CM | POA: Diagnosis not present

## 2018-10-18 DIAGNOSIS — R197 Diarrhea, unspecified: Secondary | ICD-10-CM | POA: Diagnosis not present

## 2018-10-18 MED ORDER — ONDANSETRON 4 MG PO TBDP
4.0000 mg | ORAL_TABLET | Freq: Once | ORAL | Status: AC
  Administered 2018-10-18: 4 mg via ORAL
  Filled 2018-10-18: qty 1

## 2018-10-18 MED ORDER — ONDANSETRON HCL 4 MG PO TABS: 4.0000 mg | ORAL_TABLET | Freq: Four times a day (QID) | ORAL | 0 refills | Status: DC

## 2018-10-18 NOTE — Discharge Instructions (Signed)
Please return for any problem.  Follow-up with your regular care provider as instructed.  Drink plenty of fluids.  Use Zofran for nausea.

## 2018-10-18 NOTE — ED Provider Notes (Signed)
[facility] EMERGENCY DEPARTMENT Provider Note   CSN: [id number] Arrival date & time: 10/18/18  1102     History   Chief Complaint Chief Complaint  Patient presents with  . Generalized Body Aches    HPI Kylie Harris is a 63 y.o. female.  63 year old female with prior medical history as detailed below presents for evaluation of nausea, vomiting, and diarrhea.  Symptoms started early this morning.  She reports that she still went to work.  While she was at work she felt more nauseated and decided to come to the ED for evaluation.  She denies fever.  She denies chest pain or shortness of breath.  She reports that her nausea is moderately improved since when it first started early this morning.  She does report multiple episodes of loose watery stool.  She denies any hematemesis or blood in the stool.  The history is provided by the patient and medical records.  Emesis  Severity:  Mild Duration:  12 hours Timing:  Rare Quality:  Stomach contents Able to tolerate:  Liquids Progression:  Partially resolved Chronicity:  New Recent urination:  Normal Relieved by:  Nothing Worsened by:  Nothing Ineffective treatments:  None tried   Past Medical History:  Diagnosis Date  . Cataract    left eye  . Detached retina    right  . Glaucoma   . Hypertension    takes Metoprolol,Micardis,Amlodipine,and CLonidine daily  . Vertigo     Patient Active Problem List   Diagnosis Date Noted  . Essential hypertension 08/28/2014  . Onychomycosis 03/01/2014  . Legally blind in right eye, as defined in [place] 03/01/2014  . Uncontrolled hypertension 03/01/2014  . Glaucoma 03/01/2014  . Breast cancer screening 03/01/2014  . Tinea cruris 04/04/2013  . 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 THYROID NODULE 03/11/2010  . ERUCTATION 01/27/2008  . HYPERTENSION 05/19/2007    Past Surgical History:  Procedure Laterality Date  . EYE SURGERY     cataract removed from right  . MEMBRANE PEEL  09/15/2012   Procedure: MEMBRANE PEEL;  Surgeon: Hurman Horn, MD;  Location: [facility];  Service: Ophthalmology;  Laterality: Right;  . PARS PLANA VITRECTOMY  09/15/2012   Procedure: PARS PLANA VITRECTOMY WITH 25 GAUGE;  Surgeon: Hurman Horn, MD;  Location: [facility];  Service: Ophthalmology;  Laterality: Right;  SILICONE OIL  . [place] INJECTION  09/15/2012   Procedure: PERFLUORONE INJECTION;  Surgeon: Hurman Horn, MD;  Location: [facility];  Service: Ophthalmology;  Laterality: Right;  . PHOTOCOAGULATION WITH LASER  09/15/2012   Procedure: PHOTOCOAGULATION WITH LASER;  Surgeon: Hurman Horn, MD;  Location: [facility];  Service: Ophthalmology;  Laterality: Right;  ENDOLASER  . SCLERAL BUCKLE WITH CRYO  07/08/2012   Procedure: SCLERAL BUCKLE WITH CRYO;  Surgeon: Hurman Horn, MD;  Location: [facility];  Service: Ophthalmology;  Laterality: Right;  CRYOPEXY  . TOTAL ABDOMINAL HYSTERECTOMY  1990   Done for fibroids and bleeding     OB History    Gravida  2   Para      Term      Preterm      AB  2   Living  0     SAB  2   TAB      Ectopic      Multiple      Live Births  Home Medications    Prior to Admission medications   Medication Sig Start Date End Date Taking? Authorizing Provider  amLODipine (NORVASC) 10 MG tablet Take 1 tablet (10 mg total) by mouth daily. 03/09/18   Argentina Donovan, PA-C  ibuprofen (ADVIL,MOTRIN) 200 MG tablet Take 400 mg by mouth every 6 (six) hours as needed for headache or moderate pain.     [provider]  losartan-hydrochlorothiazide (HYZAAR) 50-12.5 MG tablet Take 1 tablet by mouth daily. 03/09/18   Argentina Donovan, PA-C  meclizine (ANTIVERT) 25 MG tablet Take 1 tablet (25 mg total) by mouth 3 (three) times daily as needed for dizziness. Patient not taking: Reported on 01/27/2017 12/15/16   Virgel Manifold, MD  methocarbamol (ROBAXIN) 500 MG tablet Take 1 tablet (500 mg total) by mouth every  8 (eight) hours as needed for muscle spasms. 03/09/18   Argentina Donovan, PA-C  metoprolol succinate (TOPROL-XL) 50 MG 24 hr tablet Take 2 tablets (100 mg total) by mouth daily. 03/09/18   Argentina Donovan, PA-C  terbinafine (LAMISIL) 250 MG tablet Take 1 tablet (250 mg total) by mouth daily. Patient not taking: Reported on 07/15/2018 01/27/17   Tresa Garter, MD  vitamin C (ASCORBIC ACID) 500 MG tablet Take 500 mg by mouth daily.    [provider]    Family History Family History  Problem Relation Age of Onset  . Hypertension Mother   . Heart failure Mother   . Diabetes Mother   . Colon cancer Neg Hx   . Colon polyps Neg Hx   . Esophageal cancer Neg Hx   . Stomach cancer Neg Hx   . Rectal cancer Neg Hx     Social History Social History   Tobacco Use  . Smoking status: Never Smoker  . Smokeless tobacco: Never Used  Substance Use Topics  . Alcohol use: Yes    Alcohol/week: 2.0 standard drinks    Types: 2 Glasses of wine per week    Comment: states, "every now and then"  . Drug use: No     Allergies   Patient has no known allergies.   Review of Systems Review of Systems  Gastrointestinal: Positive for vomiting.  All other systems reviewed and are negative.    Physical Exam Updated Vital Signs BP 113/72   Pulse 70   Temp 98.8 F (37.1 C) (Oral)   Resp 18   SpO2 98%   Physical Exam Vitals signs and nursing note reviewed.  Constitutional:      General: She is not in acute distress.    Appearance: Normal appearance. She is well-developed.  HENT:     Head: Normocephalic and atraumatic.  Eyes:     Conjunctiva/sclera: Conjunctivae normal.     Pupils: Pupils are equal, round, and reactive to light.  Neck:     Musculoskeletal: Normal range of motion and neck supple.  Cardiovascular:     Rate and Rhythm: Normal rate and regular rhythm.     Heart sounds: Normal heart sounds.  Pulmonary:     Effort: Pulmonary effort is normal. No respiratory  distress.     Breath sounds: Normal breath sounds.  Abdominal:     General: There is no distension.     Palpations: Abdomen is soft.     Tenderness: There is no abdominal tenderness.  Musculoskeletal: Normal range of motion.        General: No deformity.  Skin:    General: Skin is warm and dry.  Neurological:     General: No focal deficit present.     Mental Status: She is alert and oriented to person, place, and time. Mental status is at baseline.     Cranial Nerves: No cranial nerve deficit.     Sensory: No sensory deficit.     Motor: No weakness.     Coordination: Coordination normal.      ED Treatments / Results  Labs (all labs ordered are listed, but only abnormal results are displayed) Labs Reviewed - No data to display  EKG None  Radiology No results found.  Procedures Procedures (including critical care time)  Medications Ordered in ED Medications  ondansetron (ZOFRAN-ODT) disintegrating tablet 4 mg (has no administration in time range)     Initial Impression / Assessment and Plan / ED Course  I have reviewed the triage vital signs and the nursing notes.  Pertinent labs & imaging results that were available during my care of the patient were reviewed by me and considered in my medical decision making (see chart for details).     MDM  Screen complete  Patient is presenting for evaluation of nausea, vomiting, and diarrhea.  Symptoms's are consistent with likely viral gastroenteritis.  Exam is reassuringly normal.  Patient declines further ED work-up including IV fluids or labs.  Patient does understand the need for close follow-up.  Strict return precautions given and understood.  Final Clinical Impressions(s) / ED Diagnoses   Final diagnoses:  Nausea and vomiting, intractability of vomiting not specified, unspecified vomiting type  Diarrhea, unspecified type    ED Discharge Orders         Ordered    ondansetron (ZOFRAN) 4 MG tablet  Every 6  hours     10/18/18 1311           Valarie Merino, MD 10/18/18 1311

## 2018-10-18 NOTE — ED Triage Notes (Signed)
Patient complains of onset of body aches with chills since am. Alert and oriented, denies fever, NAD

## 2018-10-18 NOTE — ED Notes (Signed)
Patient verbalizes understanding of discharge instructions. Opportunity for questioning and answers were provided. Armband removed by staff, pt discharged from ED.  

## 2018-11-23 MED FILL — LOSARTAN-HCTZ 50-12.5 MG TA: 50-12.5 | 90 days supply | Qty: 90 | Fill #2

## 2018-11-23 MED FILL — METOPROLOL SUCCINATE ER 50: 50 | 90 days supply | Qty: 180 | Fill #2

## 2018-11-23 MED FILL — AMLODIPINE BESYLATE 10 MG T: 10 | 90 days supply | Qty: 90 | Fill #2

## 2019-03-08 MED FILL — AMLODIPINE BESYLATE 10 MG T: 10 | 90 days supply | Qty: 90 | Fill #3

## 2019-03-08 MED FILL — LOSARTAN-HCTZ 50-12.5 MG TA: 50-12.5 | 90 days supply | Qty: 90 | Fill #3

## 2019-03-08 MED FILL — METOPROLOL SUCCINATE ER 50: 50 | 90 days supply | Qty: 180 | Fill #3

## 2019-03-30 ENCOUNTER — Ambulatory Visit (HOSPITAL_COMMUNITY)
Admission: EM | Admit: 2019-03-30 | Discharge: 2019-03-30 | Disposition: A | Discharge status: Home / Self Care | Payer: Medicaid Other | Attending: Internal Medicine | Admitting: Internal Medicine

## 2019-03-30 ENCOUNTER — Ambulatory Visit (INDEPENDENT_AMBULATORY_CARE_PROVIDER_SITE_OTHER): Payer: Medicaid Other

## 2019-03-30 ENCOUNTER — Other Ambulatory Visit: Payer: Self-pay

## 2019-03-30 ENCOUNTER — Encounter (HOSPITAL_COMMUNITY): Payer: Self-pay

## 2019-03-30 DIAGNOSIS — M25512 Pain in left shoulder: Secondary | ICD-10-CM | POA: Diagnosis not present

## 2019-03-30 NOTE — ED Triage Notes (Signed)
Pt presents with left arm pain and right leg pain X 14 days.

## 2019-03-30 NOTE — ED Provider Notes (Signed)
[facility]    CSN: [id number] Arrival date & time: 03/30/19  2841     History   Chief Complaint Chief Complaint  Patient presents with  . Arm Pain    Left  . Leg Pain    Right    HPI Kylie Harris is a 63 y.o. female with a history of hypertension on antihypertensive agents comes to urgent care with complaints of left shoulder pain and diminished range of motion around the left shoulder of one-month duration.  Symptoms started about a month ago and is gotten progressively worse.  Patient denies any significant preceding events.  Pain is moderate severity, constant and at times sharp.  Worsened by movement.  No relieving factors.  Patient denies any neck pain, numbness or tingling in the left upper extremity or weakness.  No trauma to the left shoulder.  Patient works in [employer].   HPI  Past Medical History:  Diagnosis Date  . Cataract    left eye  . Detached retina    right  . Glaucoma   . Hypertension    takes Metoprolol,Micardis,Amlodipine,and CLonidine daily  . Vertigo     Patient Active Problem List   Diagnosis Date Noted  . Essential hypertension 08/28/2014  . Onychomycosis 03/01/2014  . Legally blind in right eye, as defined in [place] 03/01/2014  . Uncontrolled hypertension 03/01/2014  . Glaucoma 03/01/2014  . Breast cancer screening 03/01/2014  . Tinea cruris 04/04/2013  . 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 THYROID NODULE 03/11/2010  . ERUCTATION 01/27/2008  . HYPERTENSION 05/19/2007    Past Surgical History:  Procedure Laterality Date  . EYE SURGERY     cataract removed from right  . MEMBRANE PEEL  09/15/2012   Procedure: MEMBRANE PEEL;  Surgeon: Hurman Horn, MD;  Location: [facility];  Service: Ophthalmology;  Laterality: Right;  . PARS PLANA VITRECTOMY  09/15/2012   Procedure: PARS PLANA VITRECTOMY WITH 25 GAUGE;  Surgeon: Hurman Horn, MD;  Location: [facility];  Service: Ophthalmology;  Laterality: Right;   SILICONE OIL  . [place] INJECTION  09/15/2012   Procedure: PERFLUORONE INJECTION;  Surgeon: Hurman Horn, MD;  Location: [facility];  Service: Ophthalmology;  Laterality: Right;  . PHOTOCOAGULATION WITH LASER  09/15/2012   Procedure: PHOTOCOAGULATION WITH LASER;  Surgeon: Hurman Horn, MD;  Location: [facility];  Service: Ophthalmology;  Laterality: Right;  ENDOLASER  . SCLERAL BUCKLE WITH CRYO  07/08/2012   Procedure: SCLERAL BUCKLE WITH CRYO;  Surgeon: Hurman Horn, MD;  Location: [facility];  Service: Ophthalmology;  Laterality: Right;  CRYOPEXY  . TOTAL ABDOMINAL HYSTERECTOMY  1990   Done for fibroids and bleeding    OB History    Gravida  2   Para      Term      Preterm      AB  2   Living  0     SAB  2   TAB      Ectopic      Multiple      Live Births               Home Medications    Prior to Admission medications   Medication Sig Start Date End Date Taking? Authorizing Provider  amLODipine (NORVASC) 10 MG tablet Take 1 tablet (10 mg total) by mouth daily. 03/09/18   Argentina Donovan, PA-C  ibuprofen (ADVIL,MOTRIN) 200 MG tablet Take 400 mg by mouth  every 6 (six) hours as needed for headache or moderate pain.     [provider]  losartan-hydrochlorothiazide (HYZAAR) 50-12.5 MG tablet Take 1 tablet by mouth daily. 03/09/18   Argentina Donovan, PA-C  meclizine (ANTIVERT) 25 MG tablet Take 1 tablet (25 mg total) by mouth 3 (three) times daily as needed for dizziness. Patient not taking: Reported on 01/27/2017 12/15/16   Virgel Manifold, MD  methocarbamol (ROBAXIN) 500 MG tablet Take 1 tablet (500 mg total) by mouth every 8 (eight) hours as needed for muscle spasms. 03/09/18   Argentina Donovan, PA-C  metoprolol succinate (TOPROL-XL) 50 MG 24 hr tablet Take 2 tablets (100 mg total) by mouth daily. 03/09/18   Argentina Donovan, PA-C  ondansetron (ZOFRAN) 4 MG tablet Take 1 tablet (4 mg total) by mouth every 6 (six) hours. 10/18/18   Valarie Merino, MD  terbinafine (LAMISIL)  250 MG tablet Take 1 tablet (250 mg total) by mouth daily. Patient not taking: Reported on 07/15/2018 01/27/17   Tresa Garter, MD  vitamin C (ASCORBIC ACID) 500 MG tablet Take 500 mg by mouth daily.    [provider]    Family History Family History  Problem Relation Age of Onset  . Hypertension Mother   . Heart failure Mother   . Diabetes Mother   . Colon cancer Neg Hx   . Colon polyps Neg Hx   . Esophageal cancer Neg Hx   . Stomach cancer Neg Hx   . Rectal cancer Neg Hx     Social History Social History   Tobacco Use  . Smoking status: Never Smoker  . Smokeless tobacco: Never Used  Substance Use Topics  . Alcohol use: Yes    Alcohol/week: 2.0 standard drinks    Types: 2 Glasses of wine per week    Comment: states, "every now and then"  . Drug use: No     Allergies   Patient has no known allergies.   Review of Systems Review of Systems  Constitutional: Positive for activity change. Negative for fatigue and fever.  HENT: Negative.   Respiratory: Negative.   Cardiovascular: Negative.   Gastrointestinal: Negative.   Genitourinary: Negative.   Musculoskeletal: Positive for arthralgias. Negative for back pain, gait problem, joint swelling, myalgias, neck pain and neck stiffness.  Skin: Negative for pallor, rash and wound.  Neurological: Negative for dizziness, syncope, weakness, numbness and headaches.     Physical Exam Triage Vital Signs ED Triage Vitals [03/30/19 0840]  Enc Vitals Group     BP (!) 152/83     Pulse Rate 68     Resp 16     Temp 97.9 F (36.6 C)     Temp Source Temporal     SpO2 100 %     Weight      Height      Head Circumference      Peak Flow      Pain Score      Pain Loc      Pain Edu?      Excl. in [place]?    No data found.  Updated Vital Signs BP (!) 152/83 (BP Location: Right Arm)   Pulse 68   Temp 97.9 F (36.6 C) (Temporal)   Resp 16   SpO2 100%   Visual Acuity Right Eye Distance:   Left Eye Distance:    Bilateral Distance:    Right Eye Near:   Left Eye Near:    Bilateral Near:  Physical Exam Vitals signs and nursing note reviewed.  Constitutional:      General: She is not in acute distress.    Appearance: Normal appearance. She is not ill-appearing.  Neck:     Musculoskeletal: Normal range of motion and neck supple. No neck rigidity or muscular tenderness.  Cardiovascular:     Rate and Rhythm: Normal rate and regular rhythm.     Pulses: Normal pulses.     Heart sounds: Normal heart sounds. No murmur. No gallop.   Pulmonary:     Effort: Pulmonary effort is normal. No respiratory distress.     Breath sounds: Normal breath sounds. No wheezing or rales.  Abdominal:     General: Bowel sounds are normal.     Palpations: Abdomen is soft.  Musculoskeletal:        General: No swelling, tenderness, deformity or signs of injury.     Comments: Limited range of motion.  Limitation is is more prominent with abduction.  90 degrees abduction is the most the patient can achieve.  Contours of the shoulder is unchanged.  Lymphadenopathy:     Cervical: No cervical adenopathy.  Skin:    Capillary Refill: Capillary refill takes less than 2 seconds.     Findings: No erythema or rash.  Neurological:     General: No focal deficit present.     Mental Status: She is alert and oriented to person, place, and time.      UC Treatments / Results  Labs (all labs ordered are listed, but only abnormal results are displayed) Labs Reviewed - No data to display  EKG   Radiology No results found.  Procedures Procedures (including critical care time)  Medications Ordered in UC Medications - No data to display  Initial Impression / Assessment and Plan / UC Course  I have reviewed the triage vital signs and the nursing notes.  Pertinent labs & imaging results that were available during my care of the patient were reviewed by me and considered in my medical decision making (see chart for  details).     1.  Left shoulder pain: Tylenol/Motrin as needed for pain Left shoulder x-ray independently reviewed by me shows acromioclavicular joint separation. Range of motion exercises Orthopedic surgery referral Final Clinical Impressions(s) / UC Diagnoses   Final diagnoses:  None   Discharge Instructions   None    ED Prescriptions    None     Controlled Substance Prescriptions [place] Controlled Substance Registry consulted? No   Chase Picket, MD 03/30/19 1122

## 2019-05-19 ENCOUNTER — Emergency Department (HOSPITAL_COMMUNITY)
Admission: EM | Admit: 2019-05-19 | Discharge: 2019-05-19 | Disposition: A | Discharge status: Home / Self Care | Payer: Medicaid Other | Attending: Emergency Medicine | Admitting: Emergency Medicine

## 2019-05-19 ENCOUNTER — Other Ambulatory Visit: Payer: Self-pay

## 2019-05-19 DIAGNOSIS — Y929 Unspecified place or not applicable: Secondary | ICD-10-CM | POA: Insufficient documentation

## 2019-05-19 DIAGNOSIS — Y998 Other external cause status: Secondary | ICD-10-CM | POA: Insufficient documentation

## 2019-05-19 DIAGNOSIS — Z23 Encounter for immunization: Secondary | ICD-10-CM | POA: Diagnosis not present

## 2019-05-19 DIAGNOSIS — W268XXA Contact with other sharp object(s), not elsewhere classified, initial encounter: Secondary | ICD-10-CM | POA: Insufficient documentation

## 2019-05-19 DIAGNOSIS — Y9389 Activity, other specified: Secondary | ICD-10-CM | POA: Insufficient documentation

## 2019-05-19 DIAGNOSIS — S61511A Laceration without foreign body of right wrist, initial encounter: Secondary | ICD-10-CM | POA: Diagnosis not present

## 2019-05-19 DIAGNOSIS — Z79899 Other long term (current) drug therapy: Secondary | ICD-10-CM | POA: Diagnosis not present

## 2019-05-19 DIAGNOSIS — I10 Essential (primary) hypertension: Secondary | ICD-10-CM | POA: Diagnosis not present

## 2019-05-19 DIAGNOSIS — S6991XA Unspecified injury of right wrist, hand and finger(s), initial encounter: Secondary | ICD-10-CM | POA: Diagnosis present

## 2019-05-19 MED ORDER — ONDANSETRON HCL 4 MG PO TABS
4.0000 mg | ORAL_TABLET | Freq: Once | ORAL | Status: AC
Start: 2019-05-19 — End: 2019-05-19
  Administered 2019-05-19: 4 mg via ORAL
  Filled 2019-05-19: qty 1

## 2019-05-19 MED ORDER — LIDOCAINE-EPINEPHRINE-TETRACAINE (LET) SOLUTION
3.0000 mL | Freq: Once | NASAL | Status: AC
  Administered 2019-05-19: 10:00:00 3 mL via TOPICAL
  Filled 2019-05-19: qty 3

## 2019-05-19 MED ORDER — TETANUS-DIPHTH-ACELL PERTUSSIS 5-2.5-18.5 LF-MCG/0.5 IM SUSP
0.5000 mL | Freq: Once | INTRAMUSCULAR | Status: AC
  Administered 2019-05-19: 0.5 mL via INTRAMUSCULAR
  Filled 2019-05-19: qty 0.5

## 2019-05-19 MED ORDER — OXYCODONE-ACETAMINOPHEN 5-325 MG PO TABS
1.0000 | ORAL_TABLET | Freq: Once | ORAL | Status: AC
  Administered 2019-05-19: 1 via ORAL
  Filled 2019-05-19: qty 1

## 2019-05-19 MED ORDER — LIDOCAINE-EPINEPHRINE (PF) 2 %-1:200000 IJ SOLN
20.0000 mL | Freq: Once | INTRAMUSCULAR | Status: DC
  Filled 2019-05-19: qty 20

## 2019-05-19 NOTE — Discharge Instructions (Addendum)
1. Medications: Tylenol or ibuprofen for pain, usual home medications ° °2. Treatment: ice for swelling, keep wound clean with warm soap and water and keep bandage dry, do not submerge in water for 24 hours ° °3. Follow Up: Please return in 10 days to have your stitches/staples removed or sooner if you have concerns. Return to the emergency department for increased redness, drainage of pus from the wound ° ° °WOUND CARE ° Keep area clean and dry for 24 hours. Do not remove bandage, if applied. ° After 24 hours, remove bandage and wash wound gently with mild soap and warm water. Reapply a new bandage after cleaning wound, if directed.  ° Continue daily cleansing with soap and water until stitches/staples are removed. ° Do not apply any ointments or creams to the wound while stitches/staples are in place, as this may cause delayed healing. °Return if you experience any of the following signs of infection: Swelling, redness, pus drainage, streaking, fever >101.0 F ° Return if you experience excessive bleeding that does not stop after 15-20 minutes of constant, firm pressure. ° °

## 2019-05-19 NOTE — ED Triage Notes (Signed)
Pt here for evaluation of 1.5 inch lac on R wrist that came from the serrated edge on a box of plastic wrap at her job this morning. Bleeding controlled. Tetanus within 5 years.

## 2019-05-19 NOTE — ED Provider Notes (Signed)
[facility] Provider Note   CSN: [id number] Arrival date & time: 05/19/19  [id number]     History   Chief Complaint Chief Complaint  Patient presents with  . Arm Injury    HPI Kylie Harris is a 63 y.o. female past medical history of glaucoma, hypertension, vertigo presents emergency department today with chief complaint of laceration to right wrist.  This happened just prior to arrival.  Patient states she was picking up a large box of plastic wrap when the serrated edge cut her wrist.  She is reporting pain localized to the laceration.  She describes it as burning.  Prior to arrival her supervisor put antibiotic ointment and Band-Aid on the laceration.  Did not take anything for pain prior to arrival.  Patient unsure of tetanus immunization.  She denies fever, chills, numbness, tingling, weakness.   Past Medical History:  Diagnosis Date  . Cataract    left eye  . Detached retina    right  . Glaucoma   . Hypertension    takes Metoprolol,Micardis,Amlodipine,and CLonidine daily  . Vertigo     Patient Active Problem List   Diagnosis Date Noted  . Essential hypertension 08/28/2014  . Onychomycosis 03/01/2014  . Legally blind in right eye, as defined in [place] 03/01/2014  . Uncontrolled hypertension 03/01/2014  . Glaucoma 03/01/2014  . Breast cancer screening 03/01/2014  . Tinea cruris 04/04/2013  . 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 THYROID NODULE 03/11/2010  . ERUCTATION 01/27/2008  . HYPERTENSION 05/19/2007    Past Surgical History:  Procedure Laterality Date  . EYE SURGERY     cataract removed from right  . MEMBRANE PEEL  09/15/2012   Procedure: MEMBRANE PEEL;  Surgeon: Hurman Horn, MD;  Location: [place];  Service: Ophthalmology;  Laterality: Right;  . PARS PLANA VITRECTOMY  09/15/2012   Procedure: PARS PLANA VITRECTOMY WITH 25 GAUGE;  Surgeon: Hurman Horn, MD;  Location: [place];  Service: Ophthalmology;   Laterality: Right;  SILICONE OIL  . [place] INJECTION  09/15/2012   Procedure: PERFLUORONE INJECTION;  Surgeon: Hurman Horn, MD;  Location: [place];  Service: Ophthalmology;  Laterality: Right;  . PHOTOCOAGULATION WITH LASER  09/15/2012   Procedure: PHOTOCOAGULATION WITH LASER;  Surgeon: Hurman Horn, MD;  Location: [place];  Service: Ophthalmology;  Laterality: Right;  ENDOLASER  . SCLERAL BUCKLE WITH CRYO  07/08/2012   Procedure: SCLERAL BUCKLE WITH CRYO;  Surgeon: Hurman Horn, MD;  Location: [place];  Service: Ophthalmology;  Laterality: Right;  CRYOPEXY  . TOTAL ABDOMINAL HYSTERECTOMY  1990   Done for fibroids and bleeding     OB History    Gravida  2   Para      Term      Preterm      AB  2   Living  0     SAB  2   TAB      Ectopic      Multiple      Live Births               Home Medications    Prior to Admission medications   Medication Sig Start Date End Date Taking? Authorizing Provider  amLODipine (NORVASC) 10 MG tablet Take 1 tablet (10 mg total) by mouth daily. 03/09/18   Argentina Donovan, PA-C  ibuprofen (ADVIL,MOTRIN) 200 MG tablet Take 400 mg by mouth every 6 (six) hours as needed  for headache or moderate pain.     [provider]  losartan-hydrochlorothiazide (HYZAAR) 50-12.5 MG tablet Take 1 tablet by mouth daily. 03/09/18   Argentina Donovan, PA-C  methocarbamol (ROBAXIN) 500 MG tablet Take 1 tablet (500 mg total) by mouth every 8 (eight) hours as needed for muscle spasms. 03/09/18   Argentina Donovan, PA-C  metoprolol succinate (TOPROL-XL) 50 MG 24 hr tablet Take 2 tablets (100 mg total) by mouth daily. 03/09/18   Argentina Donovan, PA-C  ondansetron (ZOFRAN) 4 MG tablet Take 1 tablet (4 mg total) by mouth every 6 (six) hours. 10/18/18   Valarie Merino, MD  vitamin C (ASCORBIC ACID) 500 MG tablet Take 500 mg by mouth daily.    [provider]    Family History Family History  Problem Relation Age of Onset  . Hypertension Mother    . Heart failure Mother   . Diabetes Mother   . Colon cancer Neg Hx   . Colon polyps Neg Hx   . Esophageal cancer Neg Hx   . Stomach cancer Neg Hx   . Rectal cancer Neg Hx     Social History Social History   Tobacco Use  . Smoking status: Never Smoker  . Smokeless tobacco: Never Used  Substance Use Topics  . Alcohol use: Yes    Alcohol/week: 2.0 standard drinks    Types: 2 Glasses of wine per week    Comment: states, "every now and then"  . Drug use: No     Allergies   Patient has no known allergies.   Review of Systems Review of Systems  Constitutional: Negative for chills and fever.  Musculoskeletal: Negative for arthralgias and joint swelling.  Skin: Positive for wound.  Neurological: Negative for weakness and numbness.     Physical Exam Updated Vital Signs BP 123/69 (BP Location: Left Arm)   Pulse (!) 58   Temp 98 F (36.7 C) (Oral)   Resp 14   Ht 5\' 2"  (1.575 m)   Wt 74.8 kg   SpO2 100%   BMI 30.18 kg/m   Physical Exam Vitals signs and nursing note reviewed.  Constitutional:      Appearance: She is well-developed. She is not ill-appearing or toxic-appearing.  HENT:     Head: Normocephalic and atraumatic.     Nose: Nose normal.  Eyes:     General: No scleral icterus.       Right eye: No discharge.        Left eye: No discharge.     Conjunctiva/sclera: Conjunctivae normal.  Neck:     Musculoskeletal: Normal range of motion.     Vascular: No JVD.  Cardiovascular:     Rate and Rhythm: Normal rate and regular rhythm.     Pulses: Normal pulses.          Radial pulses are 2+ on the right side and 2+ on the left side.     Heart sounds: Normal heart sounds.  Pulmonary:     Effort: Pulmonary effort is normal.     Breath sounds: Normal breath sounds.  Abdominal:     General: There is no distension.  Musculoskeletal: Normal range of motion.     Comments: Full range of motion of right wrist, sensation intact.  Strength and resistance intact.  Grip  strength is 5/5 bilaterally in upper extremities.  Skin:    General: Skin is warm and dry.     Comments: 4 cm laceration to volar aspect  of right arm.  Bleeding is controlled.  Neurological:     Mental Status: She is oriented to person, place, and time.     GCS: GCS eye subscore is 4. GCS verbal subscore is 5. GCS motor subscore is 6.     Comments: Fluent speech, no facial droop.  Psychiatric:        Behavior: Behavior normal.      ED Treatments / Results  Labs (all labs ordered are listed, but only abnormal results are displayed) Labs Reviewed - No data to display  EKG None  Radiology No results found.  Procedures .Marland KitchenLaceration Repair  Date/Time: 05/19/2019 11:06 AM Performed by: Cherre Robins, PA-C Authorized by: Cherre Robins, PA-C   Consent:    Consent obtained:  Verbal   Consent given by:  Patient   Risks discussed:  Infection   Alternatives discussed:  No treatment Anesthesia (see MAR for exact dosages):    Anesthesia method:  Local infiltration and topical application   Topical anesthetic:  LET   Local anesthetic:  Lidocaine 2% WITH epi Laceration details:    Location:  Shoulder/arm   Shoulder/arm location:  R lower arm   Length (cm):  4 Repair type:    Repair type:  Simple Pre-procedure details:    Preparation:  Patient was prepped and draped in usual sterile fashion Exploration:    Hemostasis achieved with:  LET and epinephrine   Wound exploration: wound explored through full range of motion and entire depth of wound probed and visualized   Treatment:    Area cleansed with:  Saline   Amount of cleaning:  Standard   Irrigation solution:  Sterile saline   Irrigation volume:  500 ml   Irrigation method:  Syringe Skin repair:    Repair method:  Sutures   Suture size:  4-0   Suture material:  Prolene   Suture technique:  Simple interrupted   Number of sutures:  4 Approximation:    Approximation:  Close Post-procedure details:     Dressing:  Bulky dressing   Patient tolerance of procedure:  Tolerated well, no immediate complications   (including critical care time)  Medications Ordered in ED Medications  lidocaine-EPINEPHrine (XYLOCAINE W/EPI) 2 %-1:200000 (PF) injection 20 mL (has no administration in time range)  oxyCODONE-acetaminophen (PERCOCET/ROXICET) 5-325 MG per tablet 1 tablet (1 tablet Oral Given 05/19/19 0952)  ondansetron (ZOFRAN) tablet 4 mg (4 mg Oral Given 05/19/19 0952)  Tdap (BOOSTRIX) injection 0.5 mL (0.5 mLs Intramuscular Given 05/19/19 0956)  lidocaine-EPINEPHrine-tetracaine (LET) solution (3 mLs Topical Given 05/19/19 0955)     Initial Impression / Assessment and Plan / ED Course  I have reviewed the triage vital signs and the nursing notes.  Pertinent labs & imaging results that were available during my care of the patient were reviewed by me and considered in my medical decision making (see chart for details).  Patient presents to the emergency department with laceration to right forearm which occurred within 2 hours PTA . Patient nontoxic appearing, resting comfortably.  She has full range of motion and sensation of right wrist, doubt tendon or nerve injury pressure irrigation performed. Radial pulses 2+. Wound explored and base of wound visualized in a bloodless field without evidence of foreign body. Laceration repair per procedure note above, tolerated well. Tetanus updated at today's visit. Do not feel that abx are indicated at this time based on wound appearance and lack of significant comorbidities. Discussed suture home care as well as need for  wound recheck and suture removal in 10 days.  I discussed results, treatment plan, need for follow-up, and return precautions with the patient including signs of infection. Provided opportunity for questions, patient confirmed understanding and is in agreement with plan.    Portions of this note were generated with Lobby[employer]. Dictation  errors may occur despite best attempts at proofreading.     Final Clinical Impressions(s) / ED Diagnoses   Final diagnoses:  Wrist laceration, right, initial encounter    ED Discharge Orders    None       Cherre Robins, PA-C 05/19/19 1108    Sherwood Gambler, MD 05/19/19 1454

## 2019-06-13 ENCOUNTER — Other Ambulatory Visit: Payer: Self-pay | Admitting: Internal Medicine

## 2019-06-13 DIAGNOSIS — Z1231 Encounter for screening mammogram for malignant neoplasm of breast: Secondary | ICD-10-CM

## 2019-06-19 ENCOUNTER — Other Ambulatory Visit: Payer: Self-pay

## 2019-06-19 ENCOUNTER — Ambulatory Visit (INDEPENDENT_AMBULATORY_CARE_PROVIDER_SITE_OTHER): Payer: Medicaid Other | Admitting: Cardiology

## 2019-06-19 ENCOUNTER — Encounter: Payer: Self-pay | Admitting: Cardiology

## 2019-06-19 VITALS — BP 145/84 | HR 54 | Temp 97.8°F | Ht 62.0 in | Wt 172.0 lb

## 2019-06-19 DIAGNOSIS — I10 Essential (primary) hypertension: Secondary | ICD-10-CM | POA: Diagnosis not present

## 2019-06-19 DIAGNOSIS — R0600 Dyspnea, unspecified: Secondary | ICD-10-CM

## 2019-06-19 DIAGNOSIS — R600 Localized edema: Secondary | ICD-10-CM

## 2019-06-19 DIAGNOSIS — R0609 Other forms of dyspnea: Secondary | ICD-10-CM

## 2019-06-19 MED ORDER — SPIRONOLACTONE 50 MG PO TABS: 25.0000 mg | ORAL_TABLET | Freq: Every day | ORAL | 3 refills | Status: DC

## 2019-06-19 NOTE — Progress Notes (Signed)
Patient referred by Nolene Ebbs, MD for leg edema  Subjective:   Kylie Harris, female    DOB: 20-Jul-1956, 63 y.o.   MRN: [id number]   Chief Complaint  Patient presents with  . Coronary Artery Disease  . New Patient (Initial Visit)  . Edema     HPI  63 y.o. African American female with hypertension, glaucoma, h/o retinal detachment, referred for evaluation of leg edema.   Patient works in [employer] at [employer]. She walks to and back from the [place], which is about a mile away. She denies any chest pain. She has occasional shortness of breath with climbing up stairs, as well as occasional leg edema. She has family h/o HF in her sister and mother.  She was recently given lasix by her PCP. She endorses liberal salt intake in her diet.   Past Medical History:  Diagnosis Date  . Cataract    left eye  . Detached retina    right  . Glaucoma   . Hypertension    takes Metoprolol,Micardis,Amlodipine,and CLonidine daily  . Vertigo      Past Surgical History:  Procedure Laterality Date  . EYE SURGERY     cataract removed from right  . MEMBRANE PEEL  09/15/2012   Procedure: MEMBRANE PEEL;  Surgeon: Hurman Horn, MD;  Location: [place];  Service: Ophthalmology;  Laterality: Right;  . PARS PLANA VITRECTOMY  09/15/2012   Procedure: PARS PLANA VITRECTOMY WITH 25 GAUGE;  Surgeon: Hurman Horn, MD;  Location: [place];  Service: Ophthalmology;  Laterality: Right;  SILICONE OIL  . [place] INJECTION  09/15/2012   Procedure: PERFLUORONE INJECTION;  Surgeon: Hurman Horn, MD;  Location: [place];  Service: Ophthalmology;  Laterality: Right;  . PHOTOCOAGULATION WITH LASER  09/15/2012   Procedure: PHOTOCOAGULATION WITH LASER;  Surgeon: Hurman Horn, MD;  Location: [place];  Service: Ophthalmology;  Laterality: Right;  ENDOLASER  . SCLERAL BUCKLE WITH CRYO  07/08/2012   Procedure: SCLERAL BUCKLE WITH CRYO;  Surgeon: Hurman Horn, MD;  Location: [place];  Service:  Ophthalmology;  Laterality: Right;  CRYOPEXY  . TOTAL ABDOMINAL HYSTERECTOMY  1990   Done for fibroids and bleeding     Social History   Socioeconomic History  . Marital status: Single    Spouse name: Not on file  . Number of children: Not on file  . Years of education: Not on file  . Highest education level: Not on file  Occupational History  . Not on file  Social Needs  . Financial resource strain: Not on file  . Food insecurity    Worry: Not on file    Inability: Not on file  . Transportation needs    Medical: Not on file    Non-medical: Not on file  Tobacco Use  . Smoking status: Never Smoker  . Smokeless tobacco: Never Used  Substance and Sexual Activity  . Alcohol use: Yes    Alcohol/week: 2.0 standard drinks    Types: 2 Glasses of wine per week    Comment: states, "every now and then"  . Drug use: No  . Sexual activity: Yes    Birth control/protection: Surgical  Lifestyle  . Physical activity    Days per week: Not on file    Minutes per session: Not on file  . Stress: Not on file  Relationships  . Social Herbalist on phone: Not on file    Gets  together: Not on file    Attends religious service: Not on file    Active member of club or organization: Not on file    Attends meetings of clubs or organizations: Not on file    Relationship status: Not on file  . Intimate partner violence    Fear of current or ex partner: Not on file    Emotionally abused: Not on file    Physically abused: Not on file    Forced sexual activity: Not on file  Other Topics Concern  . Not on file  Social History Narrative  . Not on file     Family History  Problem Relation Age of Onset  . Hypertension Mother   . Heart failure Mother   . Diabetes Mother   . Heart failure Sister   . Colon cancer Neg Hx   . Colon polyps Neg Hx   . Esophageal cancer Neg Hx   . Stomach cancer Neg Hx   . Rectal cancer Neg Hx      Current Outpatient Medications on File Prior to  Visit  Medication Sig Dispense Refill  . amLODipine (NORVASC) 10 MG tablet Take 1 tablet (10 mg total) by mouth daily. 90 tablet 3  . dorzolamide-timolol (COSOPT) 22.3-6.8 MG/ML ophthalmic solution INT 1 GTT IN OU BID    . DUREZOL 0.05 % EMUL INT 1 GTT IN OS TID UTD    . furosemide (LASIX) 20 MG tablet Take 1 tablet by mouth daily.    Marland Kitchen ibuprofen (ADVIL,MOTRIN) 200 MG tablet Take 400 mg by mouth every 6 (six) hours as needed for headache or moderate pain.     . metoprolol succinate (TOPROL-XL) 50 MG 24 hr tablet Take 2 tablets (100 mg total) by mouth daily. (Patient taking differently: Take 50 mg by mouth daily. ) 180 tablet 3  . ondansetron (ZOFRAN) 4 MG tablet Take 1 tablet (4 mg total) by mouth every 6 (six) hours. 12 tablet 0  . vitamin C (ASCORBIC ACID) 500 MG tablet Take 500 mg by mouth daily.    . Vitamin D, Ergocalciferol, (DRISDOL) 1.25 MG (50000 UT) CAPS capsule TK 1 C PO 1 TIME A WK     No current facility-administered medications on file prior to visit.     Cardiovascular studies:  EKG 05/26/2019: Sinus rhythm 56 bpm. Nonspecific ST-T changes. Unchanged compared to prior EKG in 2019.   Recent labs: 05/26/2019: Glucose 93. BUN/Cr 17/0.84. eGFR normal. Na/K 141/3.4.  H/H 12.5/38. MCV 82.6. Platelets 440. Chol 144, TG 137, HDL 42, LDL 78 TSH normal Vit D 19 low  05/15/2018: Glucose 101. BUN/Cr 12/0.62. eGFR normal. Na/K 139/3.1.  Chol 171, TG 131, HDL 60, LDL 88  Review of Systems  Constitution: Negative for decreased appetite, malaise/fatigue, weight gain and weight loss.  HENT: Negative for congestion.   Eyes: Negative for visual disturbance.  Cardiovascular: Positive for dyspnea on exertion and leg swelling. Negative for chest pain, palpitations and syncope.  Respiratory: Negative for cough.   Endocrine: Negative for cold intolerance.  Hematologic/Lymphatic: Does not bruise/bleed easily.  Skin: Negative for itching and rash.  Musculoskeletal: Negative for myalgias.   Gastrointestinal: Negative for abdominal pain, nausea and vomiting.  Genitourinary: Negative for dysuria.  Neurological: Negative for dizziness and weakness.  Psychiatric/Behavioral: The patient is not nervous/anxious.   All other systems reviewed and are negative.        Vitals:   06/19/19 1045 06/19/19 1114  BP: (!) 157/89 (!) 145/84  Pulse: 63 (!)  54  Temp: 97.8 F (36.6 C)   SpO2: 98%      Body mass index is 31.46 kg/m. Filed Weights   06/19/19 1045  Weight: 172 lb (78 kg)     Objective:   Physical Exam  Constitutional: She is oriented to person, place, and time. She appears well-developed and well-nourished. No distress.  HENT:  Head: Normocephalic and atraumatic.  Eyes: Pupils are equal, round, and reactive to light. Conjunctivae are normal.  Neck: No JVD present.  Cardiovascular: Normal rate, regular rhythm and intact distal pulses.  No murmur heard. Pulmonary/Chest: Effort normal and breath sounds normal. She has no wheezes. She has no rales.  Abdominal: Soft. Bowel sounds are normal. There is no rebound.  Musculoskeletal:        General: Edema (1+ bl) present.  Lymphadenopathy:    She has no cervical adenopathy.  Neurological: She is alert and oriented to person, place, and time. No cranial nerve deficit.  Skin: Skin is warm and dry.  Psychiatric: She has a normal mood and affect.  Nursing note and vitals reviewed.         Assessment & Recommendations:   63 y.o. African American female with hypertension, glaucoma, h/o retinal detachment, referred for evaluation of leg edema.   Leg edema, exertional dyspnea: Possibly due to uncontrolled hypertension. Need to exclude HF, given her family history. Will obtain echocardiogram.  She has historically had hypokalemia. Switch losartan-HCTZ 50-12.5 to spironolactone 50 mg daily. Hopefully, this will have better diuresis and blood pressure control. Counseled regarding low salt diet.   Hypertension:  Continue baseline antihypertensive therapy, except the change above.   F/u after echocardiogram.   Thank you for referring the patient to [place]. Please feel free to contact with any questions.  Nigel Mormon, MD [facility] Pager: [phone] Office: [phone] If no answer Cell [phone]

## 2019-06-19 NOTE — Patient Instructions (Signed)

## 2019-06-22 ENCOUNTER — Other Ambulatory Visit: Payer: Self-pay | Admitting: Cardiology

## 2019-06-22 DIAGNOSIS — R0609 Other forms of dyspnea: Secondary | ICD-10-CM

## 2019-06-26 ENCOUNTER — Other Ambulatory Visit (INDEPENDENT_AMBULATORY_CARE_PROVIDER_SITE_OTHER): Payer: Medicaid Other

## 2019-06-26 ENCOUNTER — Other Ambulatory Visit: Payer: Self-pay

## 2019-06-26 DIAGNOSIS — R0609 Other forms of dyspnea: Secondary | ICD-10-CM

## 2019-06-29 LAB — BASIC METABOLIC PANEL
BUN/Creatinine Ratio: 13 (ref 12–28)
BUN: 11 mg/dL (ref 8–27)
CO2: 26 mmol/L (ref 20–29)
Calcium: 10.1 mg/dL (ref 8.7–10.3)
Chloride: 104 mmol/L (ref 96–106)
Creatinine, Ser: 0.84 mg/dL (ref 0.57–1.00)
GFR calc Af Amer: 86 mL/min/{1.73_m2} (ref >59)
GFR calc non Af Amer: 75 mL/min/{1.73_m2} (ref >59)
Glucose: 113 mg/dL — ABNORMAL HIGH (ref 65–99)
Potassium: 3.2 mmol/L — ABNORMAL LOW (ref 3.5–5.2)
Sodium: 145 mmol/L — ABNORMAL HIGH (ref 134–144)

## 2019-06-29 NOTE — Progress Notes (Signed)
Gave patient results. She verbalized understanding.

## 2019-07-07 NOTE — Progress Notes (Signed)
Patient verbalized understanding  

## 2019-07-24 ENCOUNTER — Other Ambulatory Visit: Payer: Self-pay

## 2019-07-24 ENCOUNTER — Ambulatory Visit
Admission: RE | Admit: 2019-07-24 | Discharge: 2019-07-24 | Disposition: A | Discharge status: Home / Self Care | Payer: Medicaid Other | Source: Ambulatory Visit | Attending: Internal Medicine | Admitting: Internal Medicine

## 2019-07-24 DIAGNOSIS — Z1231 Encounter for screening mammogram for malignant neoplasm of breast: Secondary | ICD-10-CM

## 2019-07-28 ENCOUNTER — Encounter: Payer: Self-pay | Admitting: Cardiology

## 2019-07-28 ENCOUNTER — Other Ambulatory Visit: Payer: Self-pay

## 2019-07-28 ENCOUNTER — Ambulatory Visit (INDEPENDENT_AMBULATORY_CARE_PROVIDER_SITE_OTHER): Payer: Medicaid Other | Admitting: Cardiology

## 2019-07-28 VITALS — BP 139/83 | HR 73 | Temp 94.8°F | Ht 62.0 in | Wt 166.2 lb

## 2019-07-28 DIAGNOSIS — R600 Localized edema: Secondary | ICD-10-CM

## 2019-07-28 DIAGNOSIS — I10 Essential (primary) hypertension: Secondary | ICD-10-CM

## 2019-07-28 MED ORDER — SPIRONOLACTONE 50 MG PO TABS: 50.0000 mg | ORAL_TABLET | Freq: Every day | ORAL | 3 refills | Status: DC

## 2019-07-28 NOTE — Progress Notes (Signed)
Patient referred by Nolene Ebbs, MD for leg edema  Subjective:   Kylie Harris, female    DOB: 1955/11/24, 63 y.o.   MRN: [id number]   Chief Complaint  Patient presents with  . Leg Swelling  . Follow-up    4 week  . Results    lab and echo     HPI  63 y.o. African American female with hypertension, glaucoma, h/o retinal detachment, referred for evaluation of leg edema.   Echocardiogram showed normal systolic and diastolic function, trace AS, mild MR, mild TR. Blood pressure and leg edema have improved since starting spironolactone.   Past Medical History:  Diagnosis Date  . Cataract    left eye  . Coronary artery disease   . Detached retina    right  . Glaucoma   . Hypertension    takes Metoprolol,Micardis,Amlodipine,and CLonidine daily  . Vertigo      Past Surgical History:  Procedure Laterality Date  . EYE SURGERY     cataract removed from right  . MEMBRANE PEEL  09/15/2012   Procedure: MEMBRANE PEEL;  Surgeon: Hurman Horn, MD;  Location: [facility];  Service: Ophthalmology;  Laterality: Right;  . PARS PLANA VITRECTOMY  09/15/2012   Procedure: PARS PLANA VITRECTOMY WITH 25 GAUGE;  Surgeon: Hurman Horn, MD;  Location: [facility];  Service: Ophthalmology;  Laterality: Right;  SILICONE OIL  . [place] INJECTION  09/15/2012   Procedure: PERFLUORONE INJECTION;  Surgeon: Hurman Horn, MD;  Location: [facility];  Service: Ophthalmology;  Laterality: Right;  . PHOTOCOAGULATION WITH LASER  09/15/2012   Procedure: PHOTOCOAGULATION WITH LASER;  Surgeon: Hurman Horn, MD;  Location: [facility];  Service: Ophthalmology;  Laterality: Right;  ENDOLASER  . SCLERAL BUCKLE WITH CRYO  07/08/2012   Procedure: SCLERAL BUCKLE WITH CRYO;  Surgeon: Hurman Horn, MD;  Location: [facility];  Service: Ophthalmology;  Laterality: Right;  CRYOPEXY  . TOTAL ABDOMINAL HYSTERECTOMY  1990   Done for fibroids and bleeding     Social History   Socioeconomic History  . Marital status: Single   Spouse name: Not on file  . Number of children: Not on file  . Years of education: Not on file  . Highest education level: Not on file  Occupational History  . Not on file  Social Needs  . Financial resource strain: Not on file  . Food insecurity    Worry: Not on file    Inability: Not on file  . Transportation needs    Medical: Not on file    Non-medical: Not on file  Tobacco Use  . Smoking status: Never Smoker  . Smokeless tobacco: Never Used  Substance and Sexual Activity  . Alcohol use: Yes    Alcohol/week: 2.0 standard drinks    Types: 2 Glasses of wine per week    Comment: states, "every now and then"  . Drug use: No  . Sexual activity: Yes    Birth control/protection: Surgical  Lifestyle  . Physical activity    Days per week: Not on file    Minutes per session: Not on file  . Stress: Not on file  Relationships  . Social Herbalist on phone: Not on file    Gets together: Not on file    Attends religious service: Not on file    Active member of club or organization: Not on file    Attends meetings of clubs or organizations: Not on file  Relationship status: Not on file  . Intimate partner violence    Fear of current or ex partner: Not on file    Emotionally abused: Not on file    Physically abused: Not on file    Forced sexual activity: Not on file  Other Topics Concern  . Not on file  Social History Narrative  . Not on file     Family History  Problem Relation Age of Onset  . Hypertension Mother   . Heart failure Mother   . Diabetes Mother   . Heart failure Sister   . Colon cancer Neg Hx   . Colon polyps Neg Hx   . Esophageal cancer Neg Hx   . Stomach cancer Neg Hx   . Rectal cancer Neg Hx      Current Outpatient Medications on File Prior to Visit  Medication Sig Dispense Refill  . amLODipine (NORVASC) 10 MG tablet Take 1 tablet (10 mg total) by mouth daily. 90 tablet 3  . furosemide (LASIX) 20 MG tablet Take 1 tablet by mouth  daily.    Marland Kitchen ibuprofen (ADVIL,MOTRIN) 200 MG tablet Take 400 mg by mouth every 6 (six) hours as needed for headache or moderate pain.     Marland Kitchen ketorolac (ACULAR) 0.5 % ophthalmic solution Place 1 drop into the left eye 4 (four) times daily.    Marland Kitchen latanoprost (XALATAN) 0.005 % ophthalmic solution Place 1 drop into both eyes at bedtime.    . metoprolol succinate (TOPROL-XL) 50 MG 24 hr tablet Take 2 tablets (100 mg total) by mouth daily. (Patient taking differently: Take 50 mg by mouth daily. ) 180 tablet 3  . spironolactone (ALDACTONE) 50 MG tablet Take 0.5 tablets (25 mg total) by mouth daily. 30 tablet 3  . vitamin C (ASCORBIC ACID) 500 MG tablet Take 500 mg by mouth daily.    . Vitamin D, Ergocalciferol, (DRISDOL) 1.25 MG (50000 UT) CAPS capsule TK 1 C PO 1 TIME A WK     No current facility-administered medications on file prior to visit.     Cardiovascular studies:  EKG 07/28/2019: Sinus rhythm 70 bpm. Left ventricular hypertrophy. Leftward axis.   Echocardiogram 06/26/2019: Left ventricle cavity is normal in size. Moderate concentric hypertrophy of the left ventricle. Normal LV systolic function with EF 67%. Normal global wall motion. Normal diastolic filling pattern. Calculated EF 67%.  Trileaflet aortic valve with trace aortic valve stenosis. No regurgitation noted.  Mild (Grade I) mitral regurgitation.  Mild tricuspid regurgitation.  No evidence of pulmonary hypertension.   Recent labs: Results for SHUNDA, RABADI (MRN [id number]) as of 07/28/2019 15:09  Ref. Range 06/28/2019 15:57  Sodium Latest Ref Range: 134 - 144 mmol/L 145 (H)  Potassium Latest Ref Range: 3.5 - 5.2 mmol/L 3.2 (L)  Chloride Latest Ref Range: 96 - 106 mmol/L 104  CO2 Latest Ref Range: 20 - 29 mmol/L 26  Glucose Latest Ref Range: 65 - 99 mg/dL 113 (H)  BUN Latest Ref Range: 8 - 27 mg/dL 11  Creatinine Latest Ref Range: 0.57 - 1.00 mg/dL 0.84  Calcium Latest Ref Range: 8.7 - 10.3 mg/dL 10.1   BUN/Creatinine Ratio Latest Ref Range: 12 - 28  13    05/26/2019: Glucose 93. BUN/Cr 17/0.84. eGFR normal. Na/K 141/3.4.  H/H 12.5/38. MCV 82.6. Platelets 440. Chol 144, TG 137, HDL 42, LDL 78 TSH normal Vit D 19 low  05/15/2018: Glucose 101. BUN/Cr 12/0.62. eGFR normal. Na/K 139/3.1.  Chol 171, TG 131, HDL 60, LDL  88  Review of Systems  Constitution: Negative for decreased appetite, malaise/fatigue, weight gain and weight loss.  HENT: Negative for congestion.   Eyes: Negative for visual disturbance.  Cardiovascular: Positive for dyspnea on exertion and leg swelling. Negative for chest pain, palpitations and syncope.  Respiratory: Negative for cough.   Endocrine: Negative for cold intolerance.  Hematologic/Lymphatic: Does not bruise/bleed easily.  Skin: Negative for itching and rash.  Musculoskeletal: Negative for myalgias.  Gastrointestinal: Negative for abdominal pain, nausea and vomiting.  Genitourinary: Negative for dysuria.  Neurological: Negative for dizziness and weakness.  Psychiatric/Behavioral: The patient is not nervous/anxious.   All other systems reviewed and are negative.        Vitals:   07/28/19 1451  BP: 139/83  Pulse: 73  Temp: (!) 94.8 F (34.9 C)  SpO2: 98%     Body mass index is 30.4 kg/m. Filed Weights   07/28/19 1451  Weight: 166 lb 3.2 oz (75.4 kg)     Objective:   Physical Exam  Constitutional: She is oriented to person, place, and time. She appears well-developed and well-nourished. No distress.  HENT:  Head: Normocephalic and atraumatic.  Eyes: Pupils are equal, round, and reactive to light. Conjunctivae are normal.  Neck: No JVD present.  Cardiovascular: Normal rate, regular rhythm and intact distal pulses.  No murmur heard. Pulmonary/Chest: Effort normal and breath sounds normal. She has no wheezes. She has no rales.  Abdominal: Soft. Bowel sounds are normal. There is no rebound.  Musculoskeletal:        General: Edema (1+ bl)  present.  Lymphadenopathy:    She has no cervical adenopathy.  Neurological: She is alert and oriented to person, place, and time. No cranial nerve deficit.  Skin: Skin is warm and dry.  Psychiatric: She has a normal mood and affect.  Nursing note and vitals reviewed.         Assessment & Recommendations:   63 y.o. African American female with hypertension, glaucoma, h/o retinal detachment, referred for evaluation of leg edema.   Leg edema, exertional dyspnea: Secondary to uncontrolled hypertension. Improved with spironolactone increase to 50 mg daily. Repeat BMP in 2 weeks.  Hypertension: Continue baseline antihypertensive therapy, except the change above.   F/u as needed.   Nigel Mormon, MD [facility] Pager: [phone] Office: [phone] If no answer Cell [phone]

## 2019-07-29 ENCOUNTER — Encounter: Payer: Self-pay | Admitting: Cardiology

## 2019-08-09 ENCOUNTER — Other Ambulatory Visit: Payer: Self-pay | Admitting: Cardiology

## 2019-08-09 DIAGNOSIS — I10 Essential (primary) hypertension: Secondary | ICD-10-CM

## 2019-08-10 LAB — BASIC METABOLIC PANEL
BUN/Creatinine Ratio: 17 (ref 12–28)
BUN: 10 mg/dL (ref 8–27)
CO2: 25 mmol/L (ref 20–29)
Calcium: 9.7 mg/dL (ref 8.7–10.3)
Chloride: 104 mmol/L (ref 96–106)
Creatinine, Ser: 0.6 mg/dL (ref 0.57–1.00)
GFR calc Af Amer: 113 mL/min/{1.73_m2} (ref >59)
GFR calc non Af Amer: 98 mL/min/{1.73_m2} (ref >59)
Glucose: 100 mg/dL — ABNORMAL HIGH (ref 65–99)
Potassium: 3.6 mmol/L (ref 3.5–5.2)
Sodium: 141 mmol/L (ref 134–144)

## 2019-08-10 NOTE — Progress Notes (Signed)
Called pt no answer, left a vm

## 2019-08-10 NOTE — Progress Notes (Signed)
Pt called back informed her about her lab results. Pt understood

## 2019-12-04 ENCOUNTER — Ambulatory Visit: Payer: Medicaid Other | Attending: Internal Medicine

## 2019-12-04 ENCOUNTER — Ambulatory Visit: Payer: Medicaid Other

## 2019-12-04 DIAGNOSIS — Z23 Encounter for immunization: Secondary | ICD-10-CM

## 2019-12-04 NOTE — Progress Notes (Signed)
   Covid-19 Vaccination Clinic  Name:  Kylie Harris    MRN: [id number] DOB: 16-Jan-1956  12/04/2019  [name] was observed post Covid-19 immunization for 15 minutes without incident. She was provided with Vaccine Information Sheet and instruction to access the V-Safe system.   [name] was instructed to call 911 with any severe reactions post vaccine: Marland Kitchen Difficulty breathing  . Swelling of face and throat  . A fast heartbeat  . A bad rash all over body  . Dizziness and weakness   Immunizations Administered    Name Date Dose VIS Date Route   Pfizer COVID-19 Vaccine 12/04/2019 12:03 PM 0.3 mL 08/18/2019 Intramuscular   Manufacturer: [place]   Lot: [id number]   [place]: [id number]

## 2019-12-27 ENCOUNTER — Ambulatory Visit: Payer: Medicaid Other | Attending: Internal Medicine

## 2019-12-27 DIAGNOSIS — Z23 Encounter for immunization: Secondary | ICD-10-CM

## 2019-12-27 NOTE — Progress Notes (Signed)
   Covid-19 Vaccination Clinic  Name:  Kylie Harris    MRN: [id number] DOB: [birth year]  12/27/2019  Ms. [name] was observed post Covid-19 immunization for 15 minutes without incident. She was provided with Vaccine Information Sheet and instruction to access the V-Safe system.   Ms. [name] was instructed to call 911 with any severe reactions post vaccine: Marland Kitchen Difficulty breathing  . Swelling of face and throat  . A fast heartbeat  . A bad rash all over body  . Dizziness and weakness   Immunizations Administered    Name Date Dose VIS Date Route   Pfizer COVID-19 Vaccine 12/27/2019 12:21 PM 0.3 mL 11/01/2018 Intramuscular   Manufacturer: [place]   Lot: [id number]   [place]: [id number]

## 2020-07-10 ENCOUNTER — Other Ambulatory Visit: Payer: Self-pay | Admitting: Internal Medicine

## 2020-07-10 DIAGNOSIS — Z1231 Encounter for screening mammogram for malignant neoplasm of breast: Secondary | ICD-10-CM

## 2020-08-20 ENCOUNTER — Ambulatory Visit: Payer: Medicaid Other

## 2020-08-23 ENCOUNTER — Ambulatory Visit
Admission: RE | Admit: 2020-08-23 | Discharge: 2020-08-23 | Disposition: A | Discharge status: Home / Self Care | Payer: Medicaid Other | Source: Ambulatory Visit | Attending: Internal Medicine | Admitting: Internal Medicine

## 2020-08-23 ENCOUNTER — Other Ambulatory Visit: Payer: Self-pay

## 2020-08-23 DIAGNOSIS — Z1231 Encounter for screening mammogram for malignant neoplasm of breast: Secondary | ICD-10-CM

## 2020-09-09 ENCOUNTER — Other Ambulatory Visit: Payer: Self-pay | Admitting: Cardiology

## 2020-09-09 DIAGNOSIS — R600 Localized edema: Secondary | ICD-10-CM

## 2020-09-09 DIAGNOSIS — I10 Essential (primary) hypertension: Secondary | ICD-10-CM

## 2020-09-10 ENCOUNTER — Other Ambulatory Visit: Payer: Self-pay | Admitting: Cardiology

## 2020-09-10 DIAGNOSIS — I10 Essential (primary) hypertension: Secondary | ICD-10-CM

## 2020-09-10 DIAGNOSIS — R600 Localized edema: Secondary | ICD-10-CM

## 2020-10-23 ENCOUNTER — Other Ambulatory Visit: Payer: Self-pay | Admitting: Internal Medicine

## 2020-10-24 LAB — CBC
HCT: 35.9 % (ref 35.0–45.0)
Hemoglobin: 11.9 g/dL (ref 11.7–15.5)
MCH: 28.6 pg (ref 27.0–33.0)
MCHC: 33.1 g/dL (ref 32.0–36.0)
MCV: 86.3 fL (ref 80.0–100.0)
MPV: 9.8 fL (ref 7.5–12.5)
Platelets: 508 10*3/uL — ABNORMAL HIGH (ref 140–400)
RBC: 4.16 10*6/uL (ref 3.80–5.10)
RDW: 13.5 % (ref 11.0–15.0)
WBC: 8.5 10*3/uL (ref 3.8–10.8)

## 2020-10-24 LAB — BASIC METABOLIC PANEL WITH GFR
BUN/Creatinine Ratio: 16 (calc) (ref 6–22)
BUN: 22 mg/dL (ref 7–25)
CO2: 29 mmol/L (ref 20–32)
Calcium: 9.5 mg/dL (ref 8.6–10.4)
Chloride: 97 mmol/L — ABNORMAL LOW (ref 98–110)
Creat: 1.39 mg/dL — ABNORMAL HIGH (ref 0.50–0.99)
GFR, Est African American: 47 mL/min/{1.73_m2} — ABNORMAL LOW (ref >=60)
GFR, Est Non African American: 40 mL/min/{1.73_m2} — ABNORMAL LOW (ref >=60)
Glucose, Bld: 100 mg/dL — ABNORMAL HIGH (ref 65–99)
Potassium: 2.5 mmol/L — CL (ref 3.5–5.3)
Sodium: 139 mmol/L (ref 135–146)

## 2020-10-24 LAB — HEMOGLOBIN A1C W/OUT EAG: Hgb A1c MFr Bld: 6.1 % of total Hgb — ABNORMAL HIGH (ref <5.7)

## 2020-11-01 ENCOUNTER — Other Ambulatory Visit: Payer: Self-pay | Admitting: Internal Medicine

## 2020-11-02 LAB — BASIC METABOLIC PANEL WITH GFR
BUN/Creatinine Ratio: 11 (calc) (ref 6–22)
BUN: 21 mg/dL (ref 7–25)
CO2: 23 mmol/L (ref 20–32)
Calcium: 10.2 mg/dL (ref 8.6–10.4)
Chloride: 102 mmol/L (ref 98–110)
Creat: 1.87 mg/dL — ABNORMAL HIGH (ref 0.50–0.99)
GFR, Est African American: 33 mL/min/{1.73_m2} — ABNORMAL LOW (ref >=60)
GFR, Est Non African American: 28 mL/min/{1.73_m2} — ABNORMAL LOW (ref >=60)
Glucose, Bld: 110 mg/dL — ABNORMAL HIGH (ref 65–99)
Potassium: 3.1 mmol/L — ABNORMAL LOW (ref 3.5–5.3)
Sodium: 137 mmol/L (ref 135–146)

## 2020-11-02 LAB — MAGNESIUM: Magnesium: 2.2 mg/dL (ref 1.5–2.5)

## 2020-11-02 LAB — EXTRA LAV TOP TUBE

## 2021-08-25 ENCOUNTER — Other Ambulatory Visit: Payer: Self-pay | Admitting: Internal Medicine

## 2021-08-25 DIAGNOSIS — Z1231 Encounter for screening mammogram for malignant neoplasm of breast: Secondary | ICD-10-CM

## 2021-09-25 ENCOUNTER — Ambulatory Visit
Admission: RE | Admit: 2021-09-25 | Discharge: 2021-09-25 | Disposition: A | Discharge status: Home / Self Care | Payer: Medicaid Other | Source: Ambulatory Visit | Attending: Internal Medicine | Admitting: Internal Medicine

## 2021-09-25 ENCOUNTER — Ambulatory Visit: Payer: Medicaid Other

## 2021-09-25 DIAGNOSIS — Z1231 Encounter for screening mammogram for malignant neoplasm of breast: Secondary | ICD-10-CM

## 2022-01-12 ENCOUNTER — Other Ambulatory Visit: Payer: Self-pay | Admitting: Internal Medicine

## 2022-01-13 LAB — COMPLETE METABOLIC PANEL WITH GFR
AG Ratio: 1.4 (calc) (ref 1.0–2.5)
ALT: 18 U/L (ref 6–29)
AST: 19 U/L (ref 10–35)
Albumin: 4.2 g/dL (ref 3.6–5.1)
Alkaline phosphatase (APISO): 59 U/L (ref 37–153)
BUN: 17 mg/dL (ref 7–25)
CO2: 27 mmol/L (ref 20–32)
Calcium: 9.6 mg/dL (ref 8.6–10.4)
Chloride: 103 mmol/L (ref 98–110)
Creat: 0.7 mg/dL (ref 0.50–1.05)
Globulin: 3.1 g/dL (calc) (ref 1.9–3.7)
Glucose, Bld: 83 mg/dL (ref 65–99)
Potassium: 3.5 mmol/L (ref 3.5–5.3)
Sodium: 140 mmol/L (ref 135–146)
Total Bilirubin: 0.5 mg/dL (ref 0.2–1.2)
Total Protein: 7.3 g/dL (ref 6.1–8.1)
eGFR: 97 mL/min/{1.73_m2} (ref >=60)

## 2022-01-13 LAB — LIPID PANEL
Cholesterol: 165 mg/dL (ref <200)
HDL: 61 mg/dL (ref >=50)
LDL Cholesterol (Calc): 82 mg/dL (calc)
Non-HDL Cholesterol (Calc): 104 mg/dL (calc) (ref <130)
Total CHOL/HDL Ratio: 2.7 (calc) (ref <5.0)
Triglycerides: 120 mg/dL (ref <150)

## 2022-01-13 LAB — CBC
HCT: 34.7 % — ABNORMAL LOW (ref 35.0–45.0)
Hemoglobin: 11.2 g/dL — ABNORMAL LOW (ref 11.7–15.5)
MCH: 28 pg (ref 27.0–33.0)
MCHC: 32.3 g/dL (ref 32.0–36.0)
MCV: 86.8 fL (ref 80.0–100.0)
MPV: 10.1 fL (ref 7.5–12.5)
Platelets: 394 10*3/uL (ref 140–400)
RBC: 4 10*6/uL (ref 3.80–5.10)
RDW: 13 % (ref 11.0–15.0)
WBC: 8.5 10*3/uL (ref 3.8–10.8)

## 2022-01-13 LAB — TSH: TSH: 3.4 mIU/L (ref 0.40–4.50)

## 2022-01-13 LAB — VITAMIN D 25 HYDROXY (VIT D DEFICIENCY, FRACTURES): Vit D, 25-Hydroxy: 45 ng/mL (ref 30–100)

## 2022-09-03 ENCOUNTER — Other Ambulatory Visit: Payer: Self-pay | Admitting: Internal Medicine

## 2022-09-03 DIAGNOSIS — Z1231 Encounter for screening mammogram for malignant neoplasm of breast: Secondary | ICD-10-CM

## 2022-10-22 ENCOUNTER — Ambulatory Visit: Payer: Medicare Other

## 2022-10-30 ENCOUNTER — Ambulatory Visit
Admission: RE | Admit: 2022-10-30 | Discharge: 2022-10-30 | Disposition: A | Discharge status: Home / Self Care | Payer: Medicare Other | Source: Ambulatory Visit | Attending: Internal Medicine | Admitting: Internal Medicine

## 2022-10-30 DIAGNOSIS — Z1231 Encounter for screening mammogram for malignant neoplasm of breast: Secondary | ICD-10-CM

## 2023-02-22 ENCOUNTER — Other Ambulatory Visit: Payer: Self-pay | Admitting: Internal Medicine

## 2023-02-23 LAB — COMPLETE METABOLIC PANEL WITH GFR
AG Ratio: 1.2 (calc) (ref 1.0–2.5)
ALT: 17 U/L (ref 6–29)
AST: 18 U/L (ref 10–35)
Albumin: 4.2 g/dL (ref 3.6–5.1)
Alkaline phosphatase (APISO): 83 U/L (ref 37–153)
BUN: 11 mg/dL (ref 7–25)
CO2: 31 mmol/L (ref 20–32)
Calcium: 10.1 mg/dL (ref 8.6–10.4)
Chloride: 101 mmol/L (ref 98–110)
Creat: 0.7 mg/dL (ref 0.50–1.05)
Globulin: 3.5 g/dL (calc) (ref 1.9–3.7)
Glucose, Bld: 98 mg/dL (ref 65–99)
Potassium: 3.5 mmol/L (ref 3.5–5.3)
Sodium: 138 mmol/L (ref 135–146)
Total Bilirubin: 0.4 mg/dL (ref 0.2–1.2)
Total Protein: 7.7 g/dL (ref 6.1–8.1)
eGFR: 96 mL/min/{1.73_m2} (ref >=60)

## 2023-02-23 LAB — VITAMIN D 25 HYDROXY (VIT D DEFICIENCY, FRACTURES): Vit D, 25-Hydroxy: 50 ng/mL (ref 30–100)

## 2023-02-23 LAB — LIPID PANEL
Cholesterol: 159 mg/dL (ref <200)
HDL: 64 mg/dL (ref >=50)
LDL Cholesterol (Calc): 75 mg/dL (calc)
Non-HDL Cholesterol (Calc): 95 mg/dL (calc) (ref <130)
Total CHOL/HDL Ratio: 2.5 (calc) (ref <5.0)
Triglycerides: 118 mg/dL (ref <150)

## 2023-02-23 LAB — CBC
HCT: 40.2 % (ref 35.0–45.0)
Hemoglobin: 12.7 g/dL (ref 11.7–15.5)
MCH: 27.2 pg (ref 27.0–33.0)
MCHC: 31.6 g/dL — ABNORMAL LOW (ref 32.0–36.0)
MCV: 86.1 fL (ref 80.0–100.0)
MPV: 9.6 fL (ref 7.5–12.5)
Platelets: 454 10*3/uL — ABNORMAL HIGH (ref 140–400)
RBC: 4.67 10*6/uL (ref 3.80–5.10)
RDW: 14.1 % (ref 11.0–15.0)
WBC: 8.7 10*3/uL (ref 3.8–10.8)

## 2023-02-23 LAB — TSH: TSH: 2.44 mIU/L (ref 0.40–4.50)

## 2023-07-26 ENCOUNTER — Encounter: Payer: Self-pay | Admitting: Gastroenterology

## 2023-10-18 ENCOUNTER — Other Ambulatory Visit: Payer: Self-pay | Admitting: Internal Medicine

## 2023-10-18 DIAGNOSIS — Z1231 Encounter for screening mammogram for malignant neoplasm of breast: Secondary | ICD-10-CM

## 2023-11-02 ENCOUNTER — Ambulatory Visit (AMBULATORY_SURGERY_CENTER): Payer: Medicare HMO

## 2023-11-02 VITALS — Ht 62.0 in | Wt 150.0 lb

## 2023-11-02 DIAGNOSIS — Z860100 Personal history of colon polyps, unspecified: Secondary | ICD-10-CM

## 2023-11-02 MED ORDER — SUFLAVE 178.7 G PO SOLR: 1.0000 | Freq: Once | ORAL | 0 refills | Status: AC

## 2023-11-02 NOTE — Progress Notes (Signed)

## 2023-11-03 ENCOUNTER — Ambulatory Visit
Admission: RE | Admit: 2023-11-03 | Discharge: 2023-11-03 | Disposition: A | Discharge status: Home / Self Care | Payer: Medicare HMO | Source: Ambulatory Visit | Attending: Internal Medicine | Admitting: Internal Medicine

## 2023-11-03 DIAGNOSIS — Z1231 Encounter for screening mammogram for malignant neoplasm of breast: Secondary | ICD-10-CM

## 2023-11-16 ENCOUNTER — Encounter: Payer: Self-pay | Admitting: Gastroenterology

## 2023-11-23 ENCOUNTER — Ambulatory Visit: Payer: Medicare Other | Admitting: Gastroenterology

## 2023-11-23 ENCOUNTER — Encounter: Payer: Self-pay | Admitting: Gastroenterology

## 2023-11-23 VITALS — BP 112/76 | HR 81 | Temp 97.3°F | Resp 25 | Ht 62.0 in | Wt 150.0 lb

## 2023-11-23 DIAGNOSIS — K635 Polyp of colon: Secondary | ICD-10-CM

## 2023-11-23 DIAGNOSIS — D123 Benign neoplasm of transverse colon: Secondary | ICD-10-CM

## 2023-11-23 DIAGNOSIS — K644 Residual hemorrhoidal skin tags: Secondary | ICD-10-CM

## 2023-11-23 DIAGNOSIS — Z1211 Encounter for screening for malignant neoplasm of colon: Secondary | ICD-10-CM | POA: Diagnosis present

## 2023-11-23 DIAGNOSIS — K641 Second degree hemorrhoids: Secondary | ICD-10-CM

## 2023-11-23 DIAGNOSIS — D127 Benign neoplasm of rectosigmoid junction: Secondary | ICD-10-CM | POA: Diagnosis not present

## 2023-11-23 DIAGNOSIS — Z860100 Personal history of colon polyps, unspecified: Secondary | ICD-10-CM

## 2023-11-23 MED ORDER — SODIUM CHLORIDE 0.9 % IV SOLN: 500.0000 mL | Freq: Once | INTRAVENOUS | Status: DC

## 2023-11-23 NOTE — Op Note (Signed)
 [facility] Patient Name: Kylie Harris Procedure Date: 11/23/2023 1:52 PM MRN: [id number] Endoscopist: Corliss Parish , MD, [id number] Age: 68 Referring MD:  Date of Birth: 1956/04/09 Gender: Female Account #: [id number] Procedure:                Colonoscopy Indications:              Surveillance: Personal history of adenomatous                            polyps on last colonoscopy > 5 years ago Medicines:                Monitored Anesthesia Care Procedure:                Pre-Anesthesia Assessment:                           - Prior to the procedure, a History and Physical                            was performed, and patient medications and                            allergies were reviewed. The patient's tolerance of                            previous anesthesia was also reviewed. The risks                            and benefits of the procedure and the sedation                            options and risks were discussed with the patient.                            All questions were answered, and informed consent                            was obtained. Prior Anticoagulants: The patient has                            taken no anticoagulant or antiplatelet agents. ASA                            Grade Assessment: II - A patient with mild systemic                            disease. After reviewing the risks and benefits,                            the patient was deemed in satisfactory condition to                            undergo the procedure.  After obtaining informed consent, the colonoscope                            was passed under direct vision. Throughout the                            procedure, the patient's blood pressure, pulse, and                            oxygen saturations were monitored continuously. The                            Olympus Scope [id number] was introduced through the                            anus and  advanced to the 3 cm into the ileum. The                            colonoscopy was performed without difficulty. The                            patient tolerated the procedure. The quality of the                            bowel preparation was adequate. The terminal ileum,                            ileocecal valve, appendiceal orifice, and rectum                            were photographed. Scope In: 2:02:16 PM Scope Out: 2:16:22 PM Scope Withdrawal Time: 0 hours 10 minutes 47 seconds  Total Procedure Duration: 0 hours 14 minutes 6 seconds  Findings:                 The digital rectal exam findings include                            hemorrhoids. Pertinent negatives include no                            palpable rectal lesions.                           The terminal ileum and ileocecal valve appeared                            normal.                           Two sessile polyps were found in the recto-sigmoid                            colon and hepatic flexure. The polyps were 2 to 4  mm in size. These polyps were removed with a cold                            snare. Resection and retrieval were complete.                           Normal mucosa was found in the entire colon                            otherwise.                           Non-bleeding non-thrombosed external and internal                            hemorrhoids were found during retroflexion, during                            perianal exam and during digital exam. The                            hemorrhoids were Grade II (internal hemorrhoids                            that prolapse but reduce spontaneously). Complications:            No immediate complications. Estimated Blood Loss:     Estimated blood loss was minimal. Impression:               - Hemorrhoids found on digital rectal exam.                           - The examined portion of the ileum was normal.                           - Two  2 to 4 mm polyps at the recto-sigmoid colon                            and at the hepatic flexure, removed with a cold                            snare. Resected and retrieved.                           - Normal mucosa in the entire examined colon                            otherwise.                           - Non-bleeding non-thrombosed external and internal                            hemorrhoids. Recommendation:           - The patient will be observed post-procedure,  until all discharge criteria are met.                           - Discharge patient to home.                           - Patient has a contact number available for                            emergencies. The signs and symptoms of potential                            delayed complications were discussed with the                            patient. Return to normal activities tomorrow.                            Written discharge instructions were provided to the                            patient.                           - High fiber diet.                           - Use FiberCon 1-2 tablets PO daily.                           - Continue present medications.                           - Await pathology results.                           - Repeat colonoscopy in 5-7 years for surveillance                            based on pathology results and history of previous                            adenomas.                           - The findings and recommendations were discussed                            with the patient.                           - The findings and recommendations were discussed                            with the designated responsible adult. Corliss Parish, MD 11/23/2023 2:23:29 PM

## 2023-11-23 NOTE — Progress Notes (Signed)
 Pt's states no medical or surgical changes since previsit or office visit.

## 2023-11-23 NOTE — Progress Notes (Signed)
 Sedate, gd SR, tolerated procedure well, VSS, report to RN

## 2023-11-23 NOTE — Patient Instructions (Signed)
Please read handouts provided. Continue present medications. Await pathology results. High Fiber Diet. Use FiberCon 1-2 tablets daily.   YOU HAD AN ENDOSCOPIC PROCEDURE TODAY AT THE [facility]:   Refer to the procedure report that was given to you for any specific questions about what was found during the examination.  If the procedure report does not answer your questions, please call your gastroenterologist to clarify.  If you requested that your care partner not be given the details of your procedure findings, then the procedure report has been included in a sealed envelope for you to review at your convenience later.  YOU SHOULD EXPECT: Some feelings of bloating in the abdomen. Passage of more gas than usual.  Walking can help get rid of the air that was put into your GI tract during the procedure and reduce the bloating. If you had a lower endoscopy (such as a colonoscopy or flexible sigmoidoscopy) you may notice spotting of blood in your stool or on the toilet paper. If you underwent a bowel prep for your procedure, you may not have a normal bowel movement for a few days.  Please Note:  You might notice some irritation and congestion in your nose or some drainage.  This is from the oxygen used during your procedure.  There is no need for concern and it should clear up in a day or so.  SYMPTOMS TO REPORT IMMEDIATELY:  Following lower endoscopy (colonoscopy or flexible sigmoidoscopy):  Excessive amounts of blood in the stool  Significant tenderness or worsening of abdominal pains  Swelling of the abdomen that is new, acute  Fever of 100F or higher   For urgent or emergent issues, a gastroenterologist can be reached at any hour by calling [phone]. Do not use MyChart messaging for urgent concerns.    DIET:  We do recommend a small meal at first, but then you may proceed to your regular diet.  Drink plenty of fluids but you should avoid alcoholic beverages for 24  hours.  ACTIVITY:  You should plan to take it easy for the rest of today and you should NOT DRIVE or use heavy machinery until tomorrow (because of the sedation medicines used during the test).    FOLLOW UP: Our staff will call the number listed on your records the next business day following your procedure.  We will call around 7:15- 8:00 am to check on you and address any questions or concerns that you may have regarding the information given to you following your procedure. If we do not reach you, we will leave a message.     If any biopsies were taken you will be contacted by phone or by letter within the next 1-3 weeks.  Please call us at [phone] if you have not heard about the biopsies in 3 weeks.    SIGNATURES/CONFIDENTIALITY: You and/or your care partner have signed paperwork which will be entered into your electronic medical record.  These signatures attest to the fact that that the information above on your After Visit Summary has been reviewed and is understood.  Full responsibility of the confidentiality of this discharge information lies with you and/or your care-partner. 

## 2023-11-23 NOTE — Progress Notes (Signed)
 Called to room to assist during endoscopic procedure.  Patient ID and intended procedure confirmed with present staff. Received instructions for my participation in the procedure from the performing physician.

## 2023-11-23 NOTE — Progress Notes (Signed)
 GASTROENTEROLOGY PROCEDURE H&P NOTE   Primary Care Physician: Fleet Contras, MD  HPI: Kylie Harris is a 68 y.o. female who presents for colonoscopy for surveillance/history of previous adenomas.  Past Medical History:  Diagnosis Date   Arthritis    Cataract    left eye   Coronary artery disease    Detached retina    right   Glaucoma    Hypertension    takes Metoprolol,Micardis,Amlodipine,and CLonidine daily   Vertigo    Past Surgical History:  Procedure Laterality Date   EYE SURGERY     cataract removed from right   MEMBRANE PEEL  09/15/2012   Procedure: MEMBRANE PEEL;  Surgeon: Edmon Crape, MD;  Location: [facility] OR;  Service: Ophthalmology;  Laterality: Right;   PARS PLANA VITRECTOMY  09/15/2012   Procedure: PARS PLANA VITRECTOMY WITH 25 GAUGE;  Surgeon: Edmon Crape, MD;  Location: [facility] OR;  Service: Ophthalmology;  Laterality: Right;  SILICONE OIL   PERFLUORONE INJECTION  09/15/2012   Procedure: PERFLUORONE INJECTION;  Surgeon: Edmon Crape, MD;  Location: [facility] OR;  Service: Ophthalmology;  Laterality: Right;   PHOTOCOAGULATION WITH LASER  09/15/2012   Procedure: PHOTOCOAGULATION WITH LASER;  Surgeon: Edmon Crape, MD;  Location: [facility] OR;  Service: Ophthalmology;  Laterality: Right;  ENDOLASER   SCLERAL BUCKLE WITH CRYO  07/08/2012   Procedure: SCLERAL BUCKLE WITH CRYO;  Surgeon: Edmon Crape, MD;  Location: [facility] OR;  Service: Ophthalmology;  Laterality: Right;  CRYOPEXY   TOTAL ABDOMINAL HYSTERECTOMY  1990   Done for fibroids and bleeding   Current Outpatient Medications  Medication Sig Dispense Refill   amLODipine (NORVASC) 10 MG tablet Take 1 tablet (10 mg total) by mouth daily. (Patient not taking: Reported on 11/02/2023) 90 tablet 3   brimonidine (ALPHAGAN) 0.2 % ophthalmic solution 1 drop 2 (two) times daily.     dorzolamide (TRUSOPT) 2 % ophthalmic solution 1 drop 2 (two) times daily.     famotidine (PEPCID) 20 MG tablet Take 20 mg by mouth every morning.      furosemide (LASIX) 20 MG tablet Take 1 tablet by mouth daily. (Patient not taking: Reported on 11/02/2023)     ibuprofen (ADVIL,MOTRIN) 200 MG tablet Take 400 mg by mouth every 6 (six) hours as needed for headache or moderate pain.      ketorolac (ACULAR) 0.5 % ophthalmic solution Place 1 drop into the left eye 4 (four) times daily.     latanoprost (XALATAN) 0.005 % ophthalmic solution Place 1 drop into both eyes at bedtime.     losartan-hydrochlorothiazide (HYZAAR) 50-12.5 MG tablet Take 1 tablet by mouth daily.     meclizine (ANTIVERT) 25 MG tablet Take 25 mg by mouth every 8 (eight) hours as needed.     metoprolol succinate (TOPROL-XL) 50 MG 24 hr tablet Take 2 tablets (100 mg total) by mouth daily. (Patient not taking: Reported on 11/02/2023) 180 tablet 3   vitamin C (ASCORBIC ACID) 500 MG tablet Take 500 mg by mouth daily.     Vitamin D, Ergocalciferol, (DRISDOL) 1.25 MG (50000 UT) CAPS capsule TK 1 C PO 1 TIME A WK     No current facility-administered medications for this visit.    Current Outpatient Medications:    amLODipine (NORVASC) 10 MG tablet, Take 1 tablet (10 mg total) by mouth daily. (Patient not taking: Reported on 11/02/2023), Disp: 90 tablet, Rfl: 3   brimonidine (ALPHAGAN) 0.2 % ophthalmic solution, 1 drop 2 (two) times daily.,  Disp: , Rfl:    dorzolamide (TRUSOPT) 2 % ophthalmic solution, 1 drop 2 (two) times daily., Disp: , Rfl:    famotidine (PEPCID) 20 MG tablet, Take 20 mg by mouth every morning., Disp: , Rfl:    furosemide (LASIX) 20 MG tablet, Take 1 tablet by mouth daily. (Patient not taking: Reported on 11/02/2023), Disp: , Rfl:    ibuprofen (ADVIL,MOTRIN) 200 MG tablet, Take 400 mg by mouth every 6 (six) hours as needed for headache or moderate pain. , Disp: , Rfl:    ketorolac (ACULAR) 0.5 % ophthalmic solution, Place 1 drop into the left eye 4 (four) times daily., Disp: , Rfl:    latanoprost (XALATAN) 0.005 % ophthalmic solution, Place 1 drop into both eyes at  bedtime., Disp: , Rfl:    losartan-hydrochlorothiazide (HYZAAR) 50-12.5 MG tablet, Take 1 tablet by mouth daily., Disp: , Rfl:    meclizine (ANTIVERT) 25 MG tablet, Take 25 mg by mouth every 8 (eight) hours as needed., Disp: , Rfl:    metoprolol succinate (TOPROL-XL) 50 MG 24 hr tablet, Take 2 tablets (100 mg total) by mouth daily. (Patient not taking: Reported on 11/02/2023), Disp: 180 tablet, Rfl: 3   vitamin C (ASCORBIC ACID) 500 MG tablet, Take 500 mg by mouth daily., Disp: , Rfl:    Vitamin D, Ergocalciferol, (DRISDOL) 1.25 MG (50000 UT) CAPS capsule, TK 1 C PO 1 TIME A WK, Disp: , Rfl:  No Known Allergies Family History  Problem Relation Age of Onset   Hypertension Mother    Heart failure Mother    Diabetes Mother    Heart failure Sister    Colon cancer Neg Hx    Colon polyps Neg Hx    Esophageal cancer Neg Hx    Stomach cancer Neg Hx    Rectal cancer Neg Hx    Social History   Socioeconomic History   Marital status: Single    Spouse name: Not on file   Number of children: 0   Years of education: Not on file   Highest education level: Not on file  Occupational History   Not on file  Tobacco Use   Smoking status: Never   Smokeless tobacco: Never  Substance and Sexual Activity   Alcohol use: Yes    Alcohol/week: 2.0 standard drinks of alcohol    Types: 2 Glasses of wine per week    Comment: states, "every now and then"   Drug use: No   Sexual activity: Yes    Birth control/protection: Surgical  Other Topics Concern   Not on file  Social History Narrative   Not on file   Social Drivers of Health   Financial Resource Strain: Not on file  Food Insecurity: Not on file  Transportation Needs: Not on file  Physical Activity: Not on file  Stress: Not on file  Social Connections: Not on file  Intimate Partner Violence: Not on file    Physical Exam: There were no vitals filed for this visit. There is no height or weight on file to calculate BMI. GEN: NAD EYE:  Sclerae anicteric ENT: MMM CV: Non-tachycardic GI: Soft, NT/ND NEURO:  Alert & Oriented x 3  Lab Results: No results for input(s): "WBC", "HGB", "HCT", "PLT" in the last 72 hours. BMET No results for input(s): "NA", "K", "CL", "CO2", "GLUCOSE", "BUN", "CREATININE", "CALCIUM" in the last 72 hours. LFT No results for input(s): "PROT", "ALBUMIN", "AST", "ALT", "ALKPHOS", "BILITOT", "BILIDIR", "IBILI" in the last 72 hours. PT/INR No results  for input(s): "LABPROT", "INR" in the last 72 hours.   Impression / Plan: This is a 68 y.o.female who presents for colonoscopy for surveillance/history of previous adenomas.  The risks and benefits of endoscopic evaluation/treatment were discussed with the patient and/or family; these include but are not limited to the risk of perforation, infection, bleeding, missed lesions, lack of diagnosis, severe illness requiring hospitalization, as well as anesthesia and sedation related illnesses.  The patient's history has been reviewed, patient examined, no change in status, and deemed stable for procedure.  The patient and/or family is agreeable to proceed.    Corliss Parish, MD [facility] Office # [phone]

## 2023-11-24 ENCOUNTER — Telehealth: Payer: Self-pay

## 2023-11-24 NOTE — Telephone Encounter (Signed)
  Follow up Call-     11/23/2023   12:57 PM  Call back number  Post procedure Call Back phone  # [phone]  Permission to leave phone message Yes     Patient questions:  Do you have a fever, pain , or abdominal swelling? No. Pain Score  0 *  Have you tolerated food without any problems? Yes  Have you been able to return to your normal activities? Yes.    Do you have any questions about your discharge instructions: Diet   No. Medications  No. Follow up visit  No.  Do you have questions or concerns about your Care? No.  Actions: * If pain score is 4 or above: No action needed, pain <4.

## 2023-11-26 LAB — SURGICAL PATHOLOGY

## 2023-11-30 ENCOUNTER — Encounter: Payer: Self-pay | Admitting: Gastroenterology

## 2023-12-11 IMAGING — MG MM DIGITAL SCREENING BILAT W/ TOMO AND CAD
8 series · 8 of 24 positions shown · non-contrast
Comparison: Previous exam(s).

CLINICAL DATA: Screening.

EXAM:
DIGITAL SCREENING BILATERAL MAMMOGRAM WITH TOMOSYNTHESIS AND CAD
TECHNIQUE: Bilateral screening digital craniocaudal and mediolateral oblique
mammograms were obtained. Bilateral screening digital breast
tomosynthesis was performed. The images were evaluated with
computer-aided detection.

[R CC synth-2D]
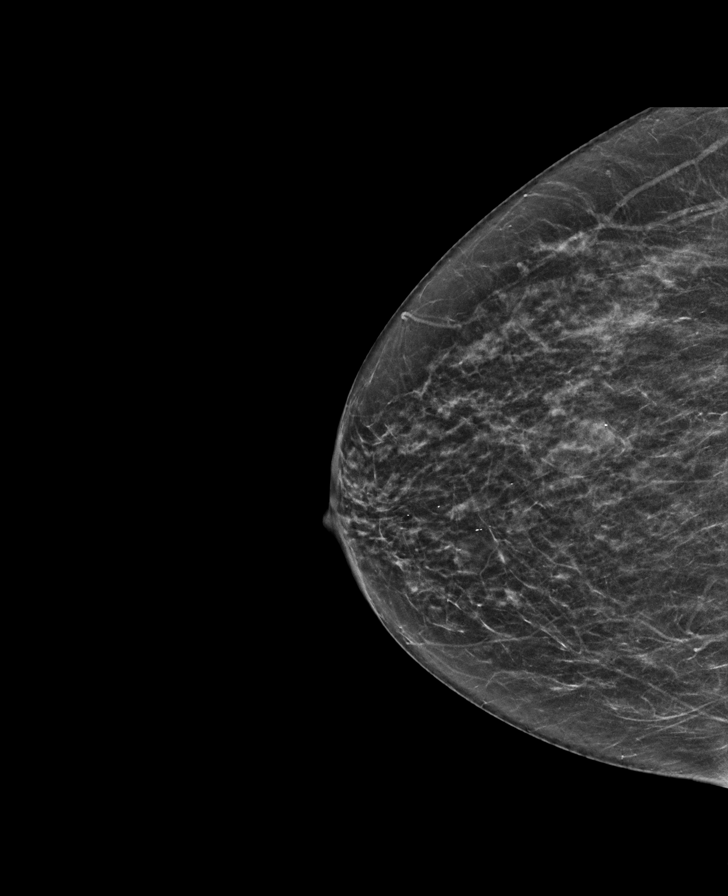

[L MLO synth-2D]
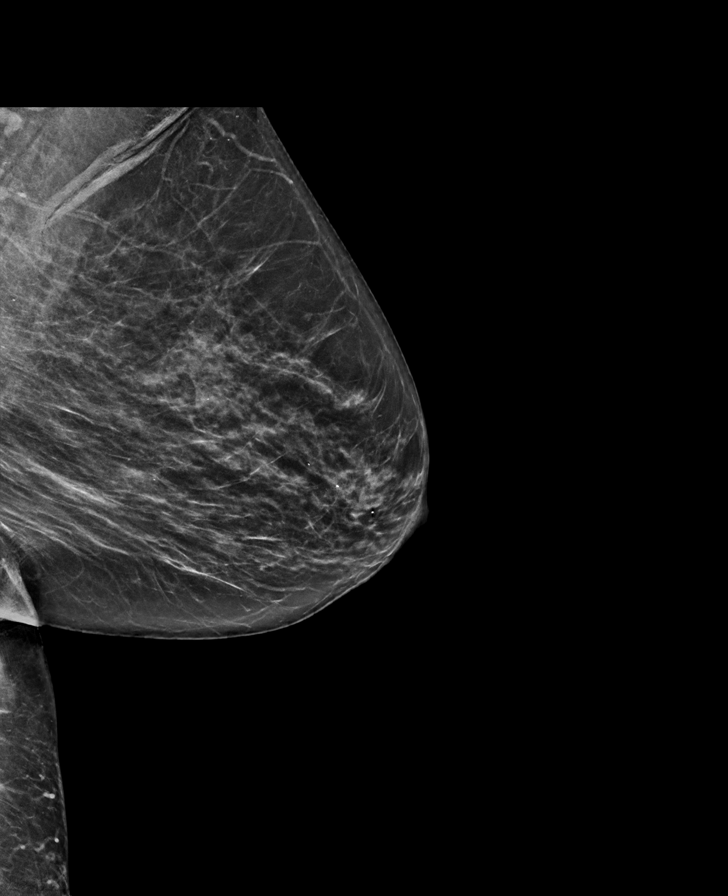

[L CC synth-2D]
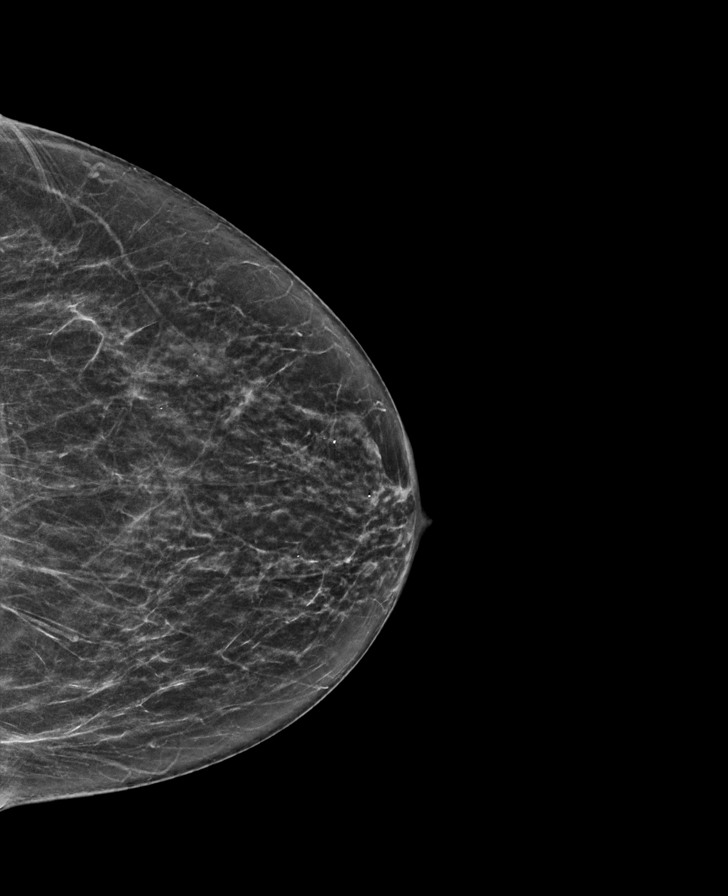

[R MLO synth-2D]
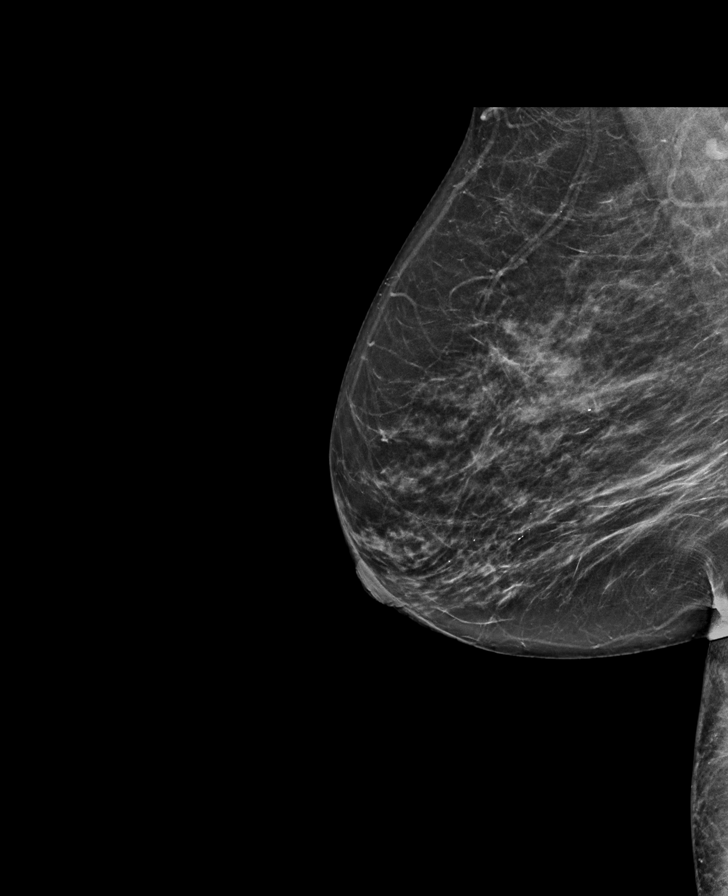

[L CC tomo · tomo slice 27/54.0]
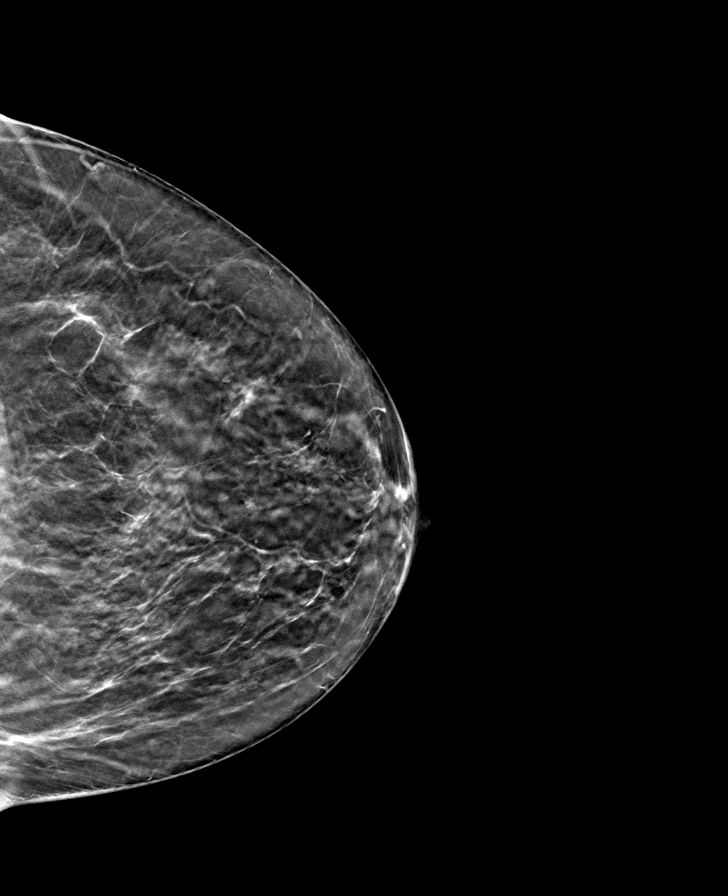

[R CC tomo · tomo slice 27/52.0]
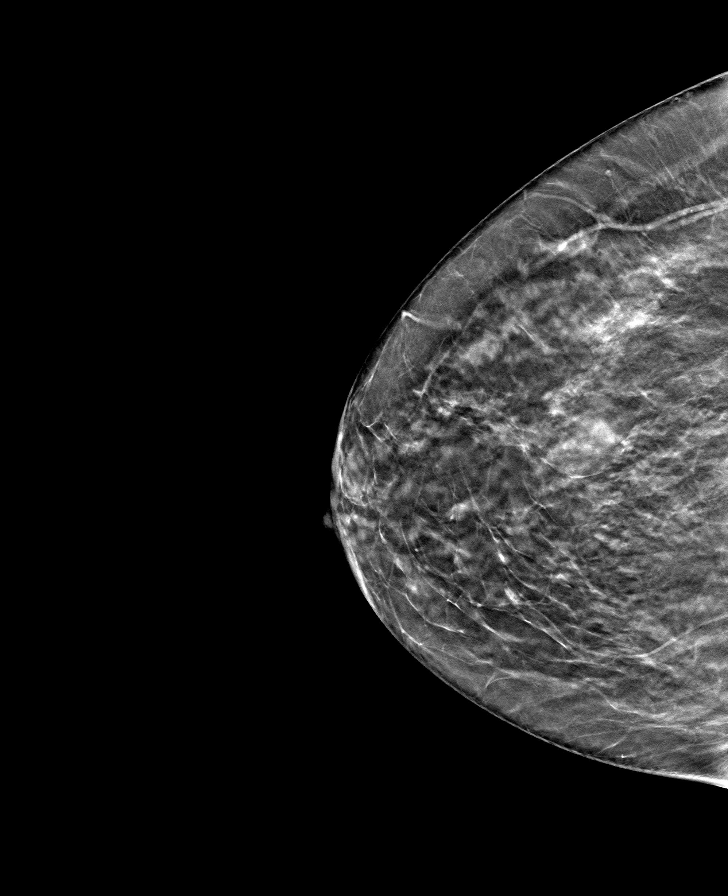

[L MLO tomo · tomo slice 31/62.0]
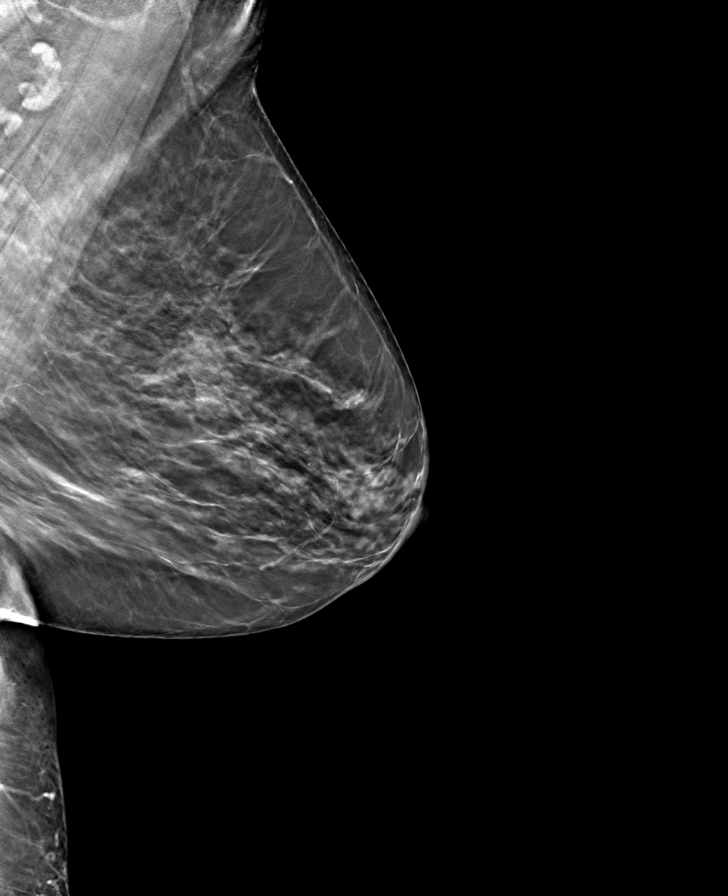

[R MLO tomo · tomo slice 31/62.0]
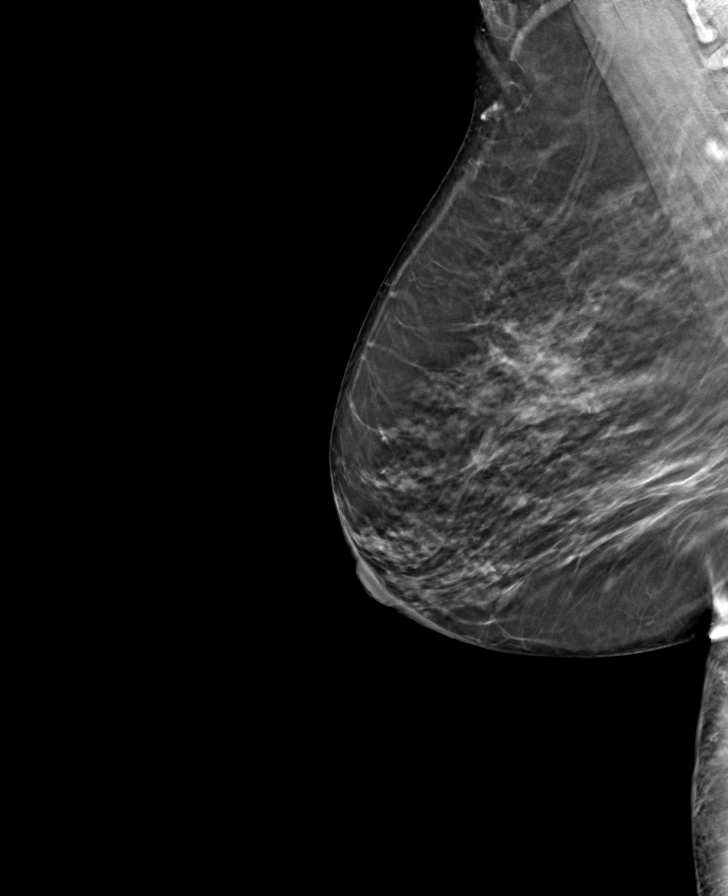

[8 of 24 positions shown; findings below may reference images not displayed]

ACR Breast Density Category b: There are scattered areas of
fibroglandular density.
FINDINGS: There are no findings suspicious for malignancy.
IMPRESSION: No mammographic evidence of malignancy. A result letter of this
screening mammogram will be mailed directly to the patient.

RECOMMENDATION:
Screening mammogram in one year. (Code:51-O-LD2)

BI-RADS CATEGORY  1: Negative.

## 2024-01-17 ENCOUNTER — Ambulatory Visit (INDEPENDENT_AMBULATORY_CARE_PROVIDER_SITE_OTHER)

## 2024-01-17 ENCOUNTER — Encounter (HOSPITAL_COMMUNITY): Payer: Self-pay

## 2024-01-17 ENCOUNTER — Ambulatory Visit (HOSPITAL_COMMUNITY)
Admission: EM | Admit: 2024-01-17 | Discharge: 2024-01-17 | Disposition: A | Discharge status: Home / Self Care | Attending: Family Medicine | Admitting: Family Medicine

## 2024-01-17 DIAGNOSIS — M7731 Calcaneal spur, right foot: Secondary | ICD-10-CM | POA: Diagnosis not present

## 2024-01-17 DIAGNOSIS — S93401A Sprain of unspecified ligament of right ankle, initial encounter: Secondary | ICD-10-CM

## 2024-01-17 DIAGNOSIS — M25571 Pain in right ankle and joints of right foot: Secondary | ICD-10-CM

## 2024-01-17 MED ORDER — IBUPROFEN 400 MG PO TABS: 400.0000 mg | ORAL_TABLET | Freq: Three times a day (TID) | ORAL | 0 refills | Status: DC | PRN

## 2024-01-17 NOTE — ED Provider Notes (Signed)
 [facility]    CSN: [id number] Arrival date & time: 01/17/24  1105      History   Chief Complaint Chief Complaint  Patient presents with   Ankle Pain    HPI Kylie Harris is a 68 y.o. female.   Patient presents today with a weeklong history of right lateral ankle pain.  She denies any known injury or trauma before symptoms began.  She reports that pain is rated 0 at rest but increases to 8/9 with attempted ambulation, localized to her right lateral ankle with radiation into her calcaneus, described as sharp, no alleviating factors identified.  She has tried a brace as well as ibuprofen with temporary improvement of symptoms.  Reports that she sprained this ankle many years ago but did not have any ongoing pain.  She has never had any surgery or had to see a specialist about ongoing foot/ankle pain.  Denies any numbness or paresthesias.    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 02/20/2013   THYROID  NODULE 03/11/2010   ERUCTATION 01/27/2008   HYPERTENSION 05/19/2007    Past Surgical History:  Procedure Laterality Date   EYE SURGERY     cataract removed from right   MEMBRANE PEEL  09/15/2012   Procedure: MEMBRANE PEEL;  Surgeon: Shon Downing, MD;  Location: [facility] OR;  Service: Ophthalmology;  Laterality: Right;   PARS PLANA VITRECTOMY  09/15/2012   Procedure: PARS PLANA VITRECTOMY WITH 25 GAUGE;   Surgeon: Shon Downing, MD;  Location: [facility] OR;  Service: Ophthalmology;  Laterality: Right;  SILICONE OIL   PERFLUORONE INJECTION  09/15/2012   Procedure: PERFLUORONE INJECTION;  Surgeon: Shon Downing, MD;  Location: [facility] OR;  Service: Ophthalmology;  Laterality: Right;   PHOTOCOAGULATION WITH LASER  09/15/2012   Procedure: PHOTOCOAGULATION WITH LASER;  Surgeon: Shon Downing, MD;  Location: [facility] OR;  Service: Ophthalmology;  Laterality: Right;  ENDOLASER   SCLERAL BUCKLE WITH CRYO  07/08/2012   Procedure: SCLERAL BUCKLE WITH CRYO;  Surgeon: Shon Downing, MD;  Location: [facility] OR;  Service: Ophthalmology;  Laterality: Right;  CRYOPEXY   TOTAL ABDOMINAL HYSTERECTOMY  1990   Done for fibroids and bleeding    OB History     Gravida  2   Para      Term      Preterm      AB  2   Living  0      SAB  2   IAB      Ectopic      Multiple      Live Births               Home Medications    Prior to Admission medications   Medication Sig Start Date End Date Taking? Authorizing Provider  ibuprofen (ADVIL) 400 MG tablet Take 1 tablet (  400 mg total) by mouth every 8 (eight) hours as needed. 01/17/24  Yes Prim Morace K, PA-C  amLODipine  (NORVASC ) 10 MG tablet Take 1 tablet (10 mg total) by mouth daily. 03/09/18   Hassie Lint, PA-C  brimonidine (ALPHAGAN) 0.2 % ophthalmic solution 1 drop 2 (two) times daily. 10/29/23   [provider]  dorzolamide (TRUSOPT) 2 % ophthalmic solution 1 drop 2 (two) times daily. 09/15/23   [provider]  famotidine (PEPCID) 20 MG tablet Take 20 mg by mouth every morning. 07/17/23   [provider]  losartan -hydrochlorothiazide (HYZAAR) 50-12.5 MG tablet Take 1 tablet by mouth daily. 08/07/23   [provider]  metoprolol  succinate (TOPROL -XL) 50 MG 24 hr tablet Take 2 tablets (100 mg total) by mouth daily. Patient not taking: Reported on 11/23/2023 03/09/18   McClung, Angela M, PA-C  vitamin C (ASCORBIC ACID) 500 MG tablet  Take 500 mg by mouth daily.    [provider]  Vitamin D , Ergocalciferol , (DRISDOL) 1.25 MG (50000 UT) CAPS capsule TK 1 C PO 1 TIME A WK 05/29/19   [provider]    Family History Family History  Problem Relation Age of Onset   Hypertension Mother    Heart failure Mother    Diabetes Mother    Heart failure Sister    Colon cancer Neg Hx    Colon polyps Neg Hx    Esophageal cancer Neg Hx    Stomach cancer Neg Hx    Rectal cancer Neg Hx     Social History Social History   Tobacco Use   Smoking status: Never   Smokeless tobacco: Never  Vaping Use   Vaping status: Never Used  Substance Use Topics   Alcohol use: Yes    Alcohol/week: 2.0 standard drinks of alcohol    Types: 2 Glasses of wine per week    Comment: states, "every now and then"   Drug use: No     Allergies   Patient has no known allergies.   Review of Systems Review of Systems  Constitutional:  Positive for activity change. Negative for appetite change, fatigue and fever.  Musculoskeletal:  Positive for arthralgias, gait problem and joint swelling. Negative for myalgias.  Skin:  Negative for color change and wound.  Neurological:  Negative for weakness and numbness.     Physical Exam Triage Vital Signs ED Triage Vitals  Encounter Vitals Group     BP 01/17/24 1214 115/75     Systolic BP Percentile --      Diastolic BP Percentile --      Pulse Rate 01/17/24 1214 71     Resp 01/17/24 1214 14     Temp 01/17/24 1214 98.3 F (36.8 C)     Temp Source 01/17/24 1214 Oral     SpO2 01/17/24 1214 97 %     Weight --      Height --      Head Circumference --      Peak Flow --      Pain Score 01/17/24 1213 9     Pain Loc --      Pain Education --      Exclude from Growth Chart --    No data found.  Updated Vital Signs BP 115/75 (BP Location: Left Arm)   Pulse 71   Temp 98.3 F (36.8 C) (Oral)   Resp 14   SpO2 97%   Visual Acuity Right Eye Distance:   Left Eye Distance:  Bilateral Distance:    Right Eye Near:   Left Eye Near:    Bilateral Near:     Physical Exam Vitals reviewed.  Constitutional:      General: She is awake. She is not in acute distress.    Appearance: Normal appearance. She is well-developed. She is not ill-appearing.     Comments: Very pleasant female appears stated age in no acute distress sitting comfortably in exam room  HENT:     Head: Normocephalic and atraumatic.  Cardiovascular:     Rate and Rhythm: Normal rate and regular rhythm.     Heart sounds: Normal heart sounds, S1 normal and S2 normal. No murmur heard.    Comments: Capillary refill within 2 seconds right toes Pulmonary:     Effort: Pulmonary effort is normal.     Breath sounds: Normal breath sounds. No wheezing, rhonchi or rales.     Comments: Clear to auscultation bilaterally Musculoskeletal:     Right ankle: Swelling present. Tenderness present over the lateral malleolus. Decreased range of motion.     Right Achilles Tendon: No tenderness.     Right foot: Normal range of motion and normal capillary refill. No swelling, tenderness or bony tenderness.     Comments: Right ankle/foot: Tenderness palpation over lateral malleolus without deformity.  Foot is neurovascularly intact.  Achilles is intact.  Strength 5/5 at ankle.  No wounds or skin lesion noted.  Thickened toenails with bright orange polish on exam.  Psychiatric:        Behavior: Behavior is cooperative.      UC Treatments / Results  Labs (all labs ordered are listed, but only abnormal results are displayed) Labs Reviewed - No data to display  EKG   Radiology No results found.  Procedures Procedures (including critical care time)  Medications Ordered in UC Medications - No data to display  Initial Impression / Assessment and Plan / UC Course  I have reviewed the triage vital signs and the nursing notes.  Pertinent labs & imaging results that were available during my care of the patient  were reviewed by me and considered in my medical decision making (see chart for details).     Patient is well-appearing, afebrile, nontoxic, nontachycardic.  X-ray was obtained based on [place] ankle rules given lateral malleolus tenderness that showed no acute osseous abnormality based on my primary read but did show calcaneal spur as well as some degenerative changes.  We discussed that this is the likely cause of her symptoms.  We were waiting for radiologist overread at the time of discharge and we will contact her if this differs and changes our treatment plan.  She was encouraged to use the RICE protocol and given a better brace for comfort and support.  Will start ibuprofen 400 mg up to 3 times a day.  No indication for dose adjustment based on metabolic panel from 02/22/2023 with creatinine of 0.7 and calculated creatinine clearance of 84.91 mL/min.  She was instructed not to take additional NSAIDs with this medication but can use Tylenol  for breakthrough pain.  Discussed that if her symptoms are not improving quickly she should follow-up with foot and ankle specialist and was given the contact information for local provider with instruction to call to schedule an appointment.  Discussed that if she has any worsening symptoms including increasing pain, numbness or paresthesias, wound, discoloration needs to be seen immediately.  Strict return precautions given.  She declined work excuse note.  Final Clinical Impressions(s) /  UC Diagnoses   Final diagnoses:  Sprain of right ankle, unspecified ligament, initial encounter  Acute right ankle pain  Calcaneal spur of right foot     Discharge Instructions      I do not see anything broken or out of place on your x-ray but did see some arthritis changes.  I will contact you if the radiologist sees something else that changes our treatment plan.  Keep your ankle elevated and use ice for 15 minutes at a time 3-4 times a day.  Use the brace for comfort  and support.  Avoid strenuous activity including lots of walking.  Take ibuprofen for pain relief.  Do not take additional NSAIDs with this medication including aspirin, ibuprofen/Advil, naproxen/Aleve.  You can use Tylenol /acetaminophen  as needed.  If your symptoms are not improving quickly please follow-up with foot and ankle specialist; call to schedule an appointment.  If anything worsens and you have increasing pain, swelling, numbness or tingling in your foot, wound you need to be seen immediately.   ED Prescriptions     Medication Sig Dispense Auth. Provider   ibuprofen (ADVIL) 400 MG tablet Take 1 tablet (400 mg total) by mouth every 8 (eight) hours as needed. 21 tablet Josep Luviano K, PA-C      PDMP not reviewed this encounter.   Budd Cargo, PA-C 01/17/24 1259

## 2024-01-17 NOTE — Discharge Instructions (Signed)
 I do not see anything broken or out of place on your x-ray but did see some arthritis changes.  I will contact you if the radiologist sees something else that changes our treatment plan.  Keep your ankle elevated and use ice for 15 minutes at a time 3-4 times a day.  Use the brace for comfort and support.  Avoid strenuous activity including lots of walking.  Take ibuprofen for pain relief.  Do not take additional NSAIDs with this medication including aspirin, ibuprofen/Advil, naproxen/Aleve.  You can use Tylenol /acetaminophen  as needed.  If your symptoms are not improving quickly please follow-up with foot and ankle specialist; call to schedule an appointment.  If anything worsens and you have increasing pain, swelling, numbness or tingling in your foot, wound you need to be seen immediately.

## 2024-01-17 NOTE — ED Triage Notes (Signed)
 Patient reports that she has had pain and slight swelling to the right outer ankle area. Patient denies any known injury.  Patient was wearing an ankle brace when she came to her triage and states the brace has helped a lot. Patient states she took Ibuprofen at 0830 today.

## 2024-05-16 ENCOUNTER — Ambulatory Visit (HOSPITAL_COMMUNITY)
Admission: EM | Admit: 2024-05-16 | Discharge: 2024-05-16 | Disposition: A | Discharge status: Home / Self Care | Attending: Family Medicine | Admitting: Family Medicine

## 2024-05-16 ENCOUNTER — Ambulatory Visit (INDEPENDENT_AMBULATORY_CARE_PROVIDER_SITE_OTHER)

## 2024-05-16 ENCOUNTER — Encounter (HOSPITAL_COMMUNITY): Payer: Self-pay

## 2024-05-16 DIAGNOSIS — M25561 Pain in right knee: Secondary | ICD-10-CM

## 2024-05-16 MED ORDER — MELOXICAM 7.5 MG PO TABS: 7.5000 mg | ORAL_TABLET | Freq: Every day | ORAL | 0 refills | Status: AC

## 2024-05-16 NOTE — ED Triage Notes (Signed)
 Pt states tripped and fell on her rt knee last Thursday in her kitchen on hardwood floors. States using ice and taking ibuprofen  with some relief. States needs a work note.

## 2024-05-16 NOTE — Discharge Instructions (Signed)
 On my review, there are not any broken bones on the x-rays.  I do see some signs of arthritis.  The radiologist will also read your x-ray, and if their interpretation differs significantly from mine, and the management of your condition would change, we will call you.  Meloxicam  7.5 mg--1 daily as needed for pain.  Do not take ibuprofen  or naproxen with this medication.  You can take Tylenol  500 mg over-the-counter--2 every 6 hours as needed for pain  Please follow-up with your primary care

## 2024-05-16 NOTE — ED Provider Notes (Signed)
 [facility]    CSN: [id number] Arrival date & time: 05/16/24  0900      History   Chief Complaint Chief Complaint  Patient presents with   Fall   Knee Pain    HPI Kylie Harris is a 68 y.o. female.    Fall  Knee Pain Here for right knee pain.  On September 4 she tripped in her kitchen and fell onto some hardwood flooring.  It has swollen some and it is hurting mainly on either side of her kneecap.  She is taking ibuprofen  400 mg without much relief.  NKDA  past medical history significant for glaucoma and hypertension.  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 02/20/2013   THYROID  NODULE 03/11/2010   ERUCTATION 01/27/2008   HYPERTENSION 05/19/2007    Past Surgical History:  Procedure Laterality Date   EYE SURGERY     cataract removed from right   MEMBRANE PEEL  09/15/2012   Procedure: MEMBRANE PEEL;  Surgeon: Arley DELENA Ruder, MD;  Location: [facility] OR;  Service: Ophthalmology;  Laterality: Right;   PARS PLANA VITRECTOMY  09/15/2012   Procedure: PARS PLANA VITRECTOMY WITH 25 GAUGE;  Surgeon: Arley DELENA Ruder, MD;  Location: [facility] OR;  Service: Ophthalmology;  Laterality: Right;  SILICONE OIL   PERFLUORONE INJECTION  09/15/2012   Procedure: PERFLUORONE INJECTION;  Surgeon: Arley DELENA Ruder, MD;  Location: [facility] OR;  Service: Ophthalmology;  Laterality: Right;   PHOTOCOAGULATION WITH LASER  09/15/2012    Procedure: PHOTOCOAGULATION WITH LASER;  Surgeon: Arley DELENA Ruder, MD;  Location: [facility] OR;  Service: Ophthalmology;  Laterality: Right;  ENDOLASER   SCLERAL BUCKLE WITH CRYO  07/08/2012   Procedure: SCLERAL BUCKLE WITH CRYO;  Surgeon: Arley DELENA Ruder, MD;  Location: [facility] OR;  Service: Ophthalmology;  Laterality: Right;  CRYOPEXY   TOTAL ABDOMINAL HYSTERECTOMY  1990   Done for fibroids and bleeding    OB History     Gravida  2   Para      Term      Preterm      AB  2   Living  0      SAB  2   IAB      Ectopic      Multiple      Live Births               Home Medications    Prior to Admission medications   Medication Sig Start Date End Date Taking? Authorizing Provider  meloxicam  (MOBIC ) 7.5 MG tablet Take 1 tablet (7.5 mg total) by mouth daily. 05/16/24  Yes Vonna Sharlet POUR, MD  Potassium Chloride  ER 20 MEQ TBCR Take by mouth. 04/25/24  Yes [provider]  amLODipine  (NORVASC ) 10 MG tablet Take 1 tablet (10 mg total) by mouth daily. 03/09/18   Danton Jon HERO,  PA-C  brimonidine (ALPHAGAN) 0.2 % ophthalmic solution 1 drop 2 (two) times daily. 10/29/23   [provider]  dorzolamide (TRUSOPT) 2 % ophthalmic solution 1 drop 2 (two) times daily. 09/15/23   [provider]  famotidine (PEPCID) 20 MG tablet Take 20 mg by mouth every morning. 07/17/23   [provider]  losartan -hydrochlorothiazide (HYZAAR) 50-12.5 MG tablet Take 1 tablet by mouth daily. 08/07/23   [provider]  metoprolol  succinate (TOPROL -XL) 50 MG 24 hr tablet Take 2 tablets (100 mg total) by mouth daily. Patient not taking: Reported on 11/23/2023 03/09/18   McClung, Angela M, PA-C  vitamin C (ASCORBIC ACID) 500 MG tablet Take 500 mg by mouth daily.    [provider]  Vitamin D , Ergocalciferol , (DRISDOL) 1.25 MG (50000 UT) CAPS capsule TK 1 C PO 1 TIME A WK 05/29/19   [provider]    Family History Family History  Problem Relation Age of Onset    Hypertension Mother    Heart failure Mother    Diabetes Mother    Heart failure Sister    Colon cancer Neg Hx    Colon polyps Neg Hx    Esophageal cancer Neg Hx    Stomach cancer Neg Hx    Rectal cancer Neg Hx     Social History Social History   Tobacco Use   Smoking status: Never   Smokeless tobacco: Never  Vaping Use   Vaping status: Never Used  Substance Use Topics   Alcohol use: Yes    Alcohol/week: 2.0 standard drinks of alcohol    Types: 2 Glasses of wine per week    Comment: states, every now and then   Drug use: No     Allergies   Patient has no known allergies.   Review of Systems Review of Systems   Physical Exam Triage Vital Signs ED Triage Vitals  Encounter Vitals Group     BP 05/16/24 1020 136/79     Girls Systolic BP Percentile --      Girls Diastolic BP Percentile --      Boys Systolic BP Percentile --      Boys Diastolic BP Percentile --      Pulse Rate 05/16/24 1020 69     Resp 05/16/24 1020 18     Temp 05/16/24 1020 98.5 F (36.9 C)     Temp Source 05/16/24 1020 Oral     SpO2 05/16/24 1020 96 %     Weight --      Height --      Head Circumference --      Peak Flow --      Pain Score 05/16/24 1021 10     Pain Loc --      Pain Education --      Exclude from Growth Chart --    No data found.  Updated Vital Signs BP 136/79 (BP Location: Right Arm)   Pulse 69   Temp 98.5 F (36.9 C) (Oral)   Resp 18   SpO2 96%   Visual Acuity Right Eye Distance:   Left Eye Distance:   Bilateral Distance:    Right Eye Near:   Left Eye Near:    Bilateral Near:     Physical Exam Vitals reviewed.  Constitutional:      General: She is not in acute distress.    Appearance: She is not ill-appearing, toxic-appearing or diaphoretic.  Musculoskeletal:     Comments: She is tender over the  patella and the medial and lateral knee.  There is some mild effusion.  Range of motion is normal.  Lower leg is not swollen.  Skin:    Coloration: Skin  is not pale.  Neurological:     General: No focal deficit present.     Mental Status: She is alert and oriented to person, place, and time.  Psychiatric:        Behavior: Behavior normal.      UC Treatments / Results  Labs (all labs ordered are listed, but only abnormal results are displayed) Labs Reviewed - No data to display  EKG   Radiology No results found.  Procedures Procedures (including critical care time)  Medications Ordered in UC Medications - No data to display  Initial Impression / Assessment and Plan / UC Course  I have reviewed the triage vital signs and the nursing notes.  Pertinent labs & imaging results that were available during my care of the patient were reviewed by me and considered in my medical decision making (see chart for details).     By my review there are no fractures on the x-rays, though there are degenerative changes.  She is advised of radiology overread  Meloxicam  is sent in to see if that will give her any better relief.  Knee brace is applied.  Work note is provided. Final Clinical Impressions(s) / UC Diagnoses   Final diagnoses:  Acute pain of right knee     Discharge Instructions      On my review, there are not any broken bones on the x-rays.  I do see some signs of arthritis.  The radiologist will also read your x-ray, and if their interpretation differs significantly from mine, and the management of your condition would change, we will call you.  Meloxicam  7.5 mg--1 daily as needed for pain.  Do not take ibuprofen  or naproxen with this medication.  You can take Tylenol  500 mg over-the-counter--2 every 6 hours as needed for pain  Please follow-up with your primary care      ED Prescriptions     Medication Sig Dispense Auth. Provider   meloxicam  (MOBIC ) 7.5 MG tablet Take 1 tablet (7.5 mg total) by mouth daily. 15 tablet Jennica Tagliaferri K, MD      PDMP not reviewed this encounter.   Vonna Sharlet POUR,  MD 05/16/24 [phone]
